# Patient Record
Sex: Female | Born: 1980 | Race: Black or African American | Hispanic: No | Marital: Single | Smoking: Current every day smoker
Health system: Southern US, Community
[De-identification: ages and names within clinical notes are randomized; demographics above are authoritative.]

---
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
## 2001-07-27 ENCOUNTER — Emergency Department (HOSPITAL_COMMUNITY): Admission: EM | Admit: 2001-07-27 | Discharge: 2001-07-28 | Payer: Self-pay | Admitting: Emergency Medicine

## 2002-09-14 ENCOUNTER — Inpatient Hospital Stay (HOSPITAL_COMMUNITY): Admission: AD | Admit: 2002-09-14 | Discharge: 2002-09-17 | Payer: Self-pay | Admitting: Obstetrics

## 2002-09-14 ENCOUNTER — Encounter (INDEPENDENT_AMBULATORY_CARE_PROVIDER_SITE_OTHER): Payer: Self-pay | Admitting: *Deleted

## 2004-06-03 ENCOUNTER — Emergency Department (HOSPITAL_COMMUNITY): Admission: EM | Admit: 2004-06-03 | Discharge: 2004-06-04 | Payer: Self-pay | Admitting: Emergency Medicine

## 2005-09-25 ENCOUNTER — Emergency Department (HOSPITAL_COMMUNITY): Admission: EM | Admit: 2005-09-25 | Discharge: 2005-09-25 | Payer: Self-pay | Admitting: Emergency Medicine

## 2006-08-03 ENCOUNTER — Inpatient Hospital Stay (HOSPITAL_COMMUNITY): Admission: AD | Admit: 2006-08-03 | Discharge: 2006-08-03 | Payer: Self-pay | Admitting: Obstetrics and Gynecology

## 2008-02-29 ENCOUNTER — Emergency Department (HOSPITAL_COMMUNITY): Admission: EM | Admit: 2008-02-29 | Discharge: 2008-03-01 | Payer: Self-pay | Admitting: Emergency Medicine

## 2008-11-08 ENCOUNTER — Inpatient Hospital Stay (HOSPITAL_COMMUNITY): Admission: EM | Admit: 2008-11-08 | Discharge: 2008-11-11 | Payer: Self-pay | Admitting: Emergency Medicine

## 2009-02-09 IMAGING — US US TRANSVAGINAL NON-OB
1 series · 14 of 25 positions shown · non-contrast
Comparison: None.

CLINICAL DATA: 25 year old with dysmenorrhea.
 TRANSABDOMINAL AND TRANSVAGINAL PELVIC ULTRASOUND ? 08/03/06:
TECHNIQUE: Both transabdominal and transvaginal ultrasound examinations of the pelvis were performed including evaluation of the uterus, ovaries, adnexal regions, and pelvic cul-de-sac.

[Series 1: us pelvis complete modify · 14 of 42 slices shown]
[im 1/42]
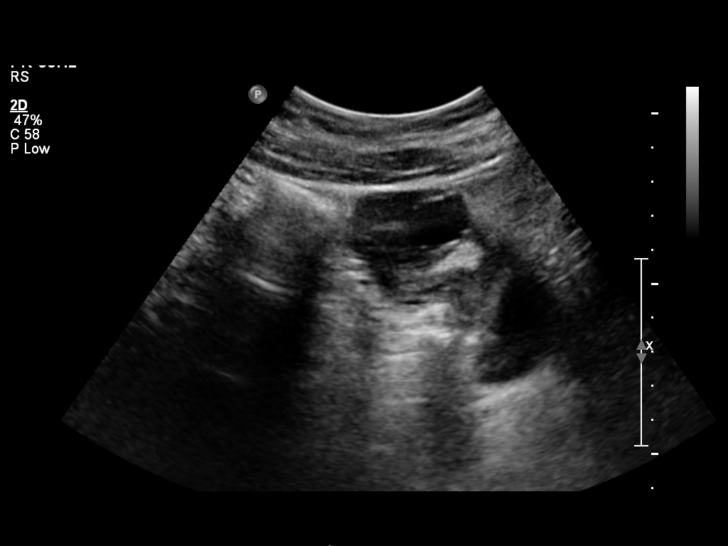
[im 4/42]
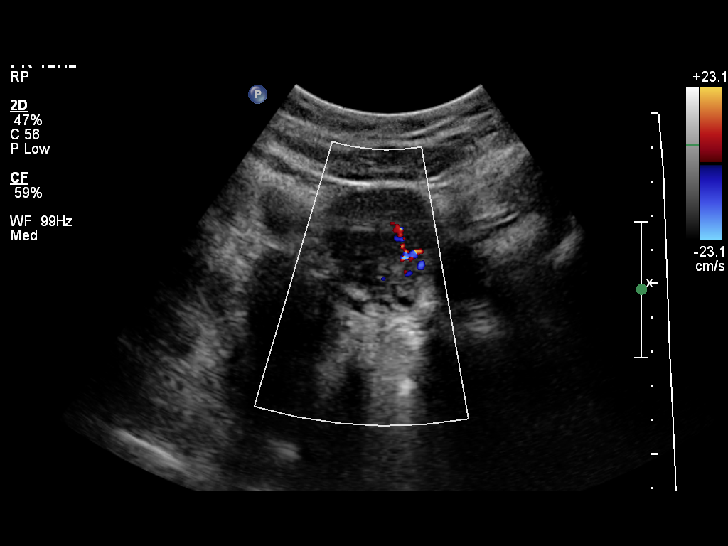
[im 7/42]
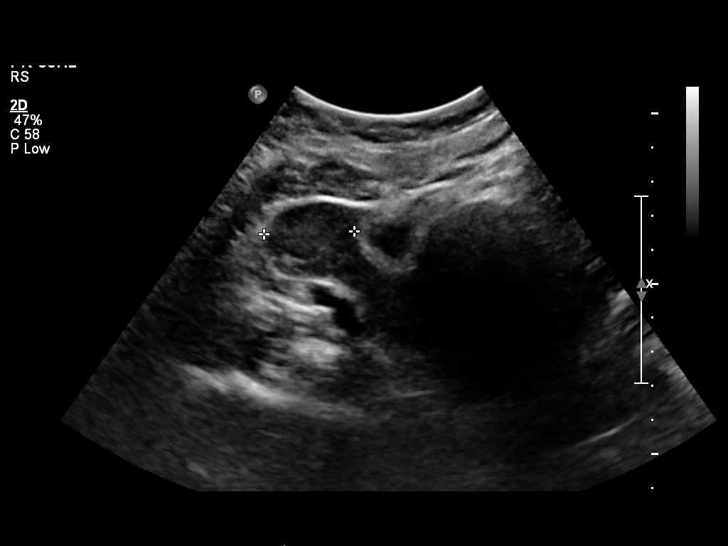
[im 11/42]
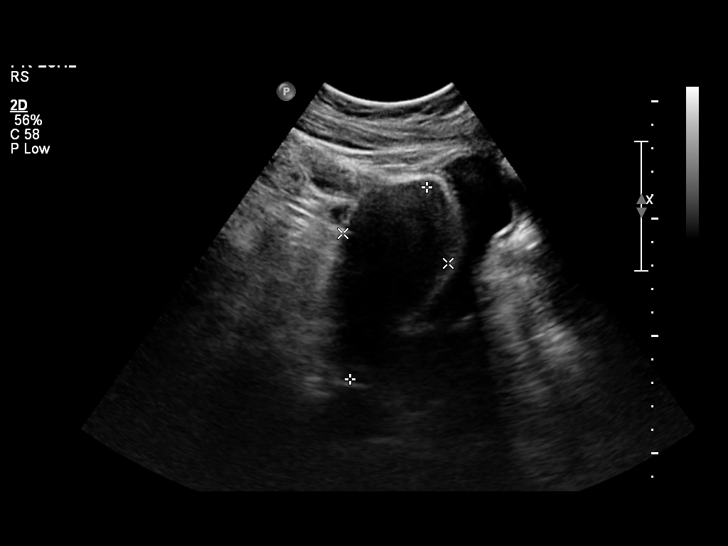
[im 14/42]
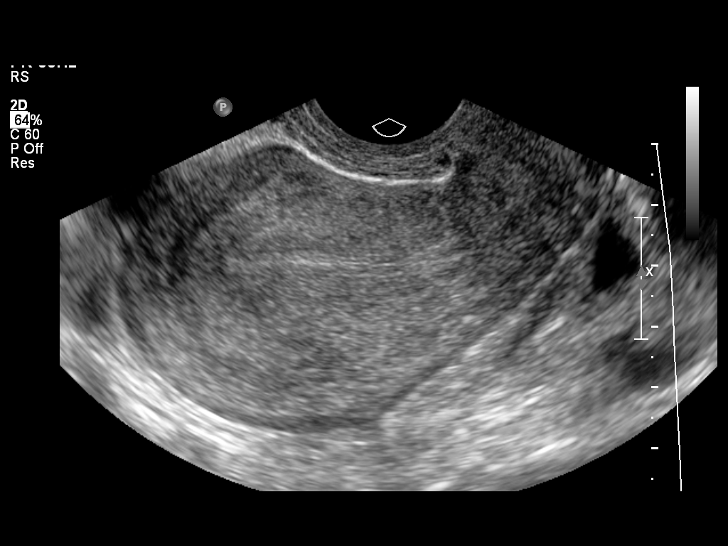
[im 16/42]
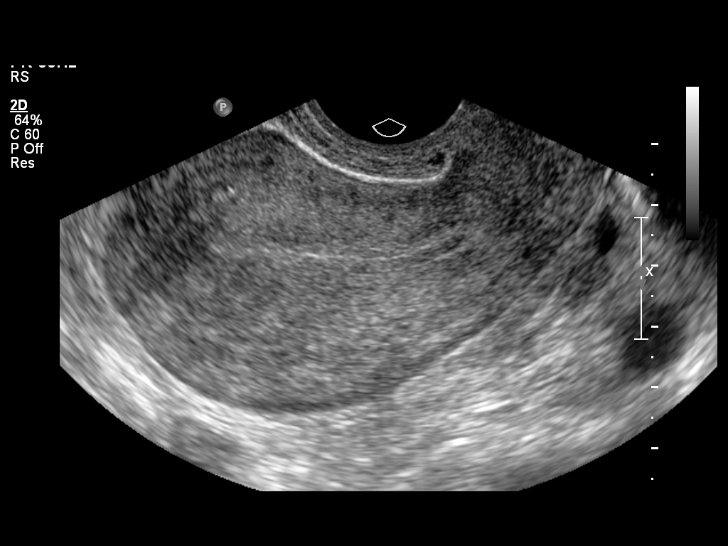
[im 19/42]
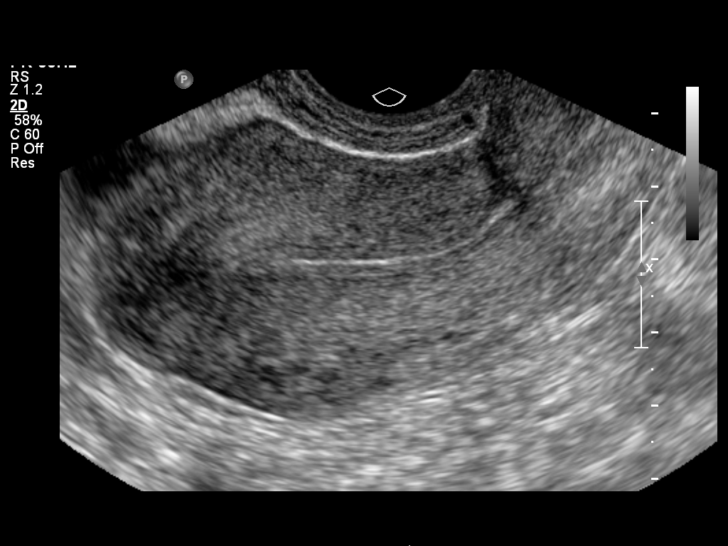
[im 23/42]
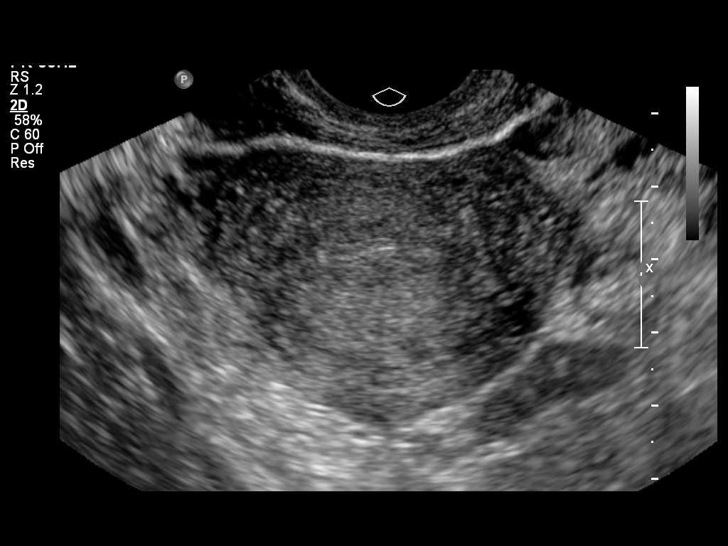
[im 26/42]
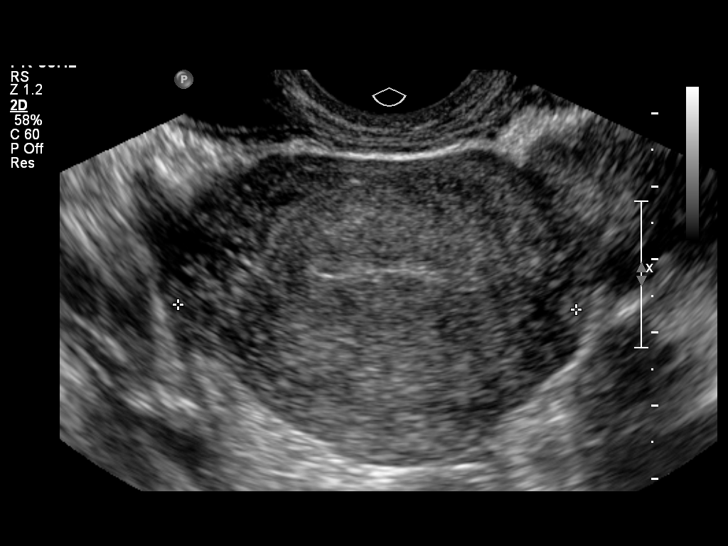
[im 28/42]
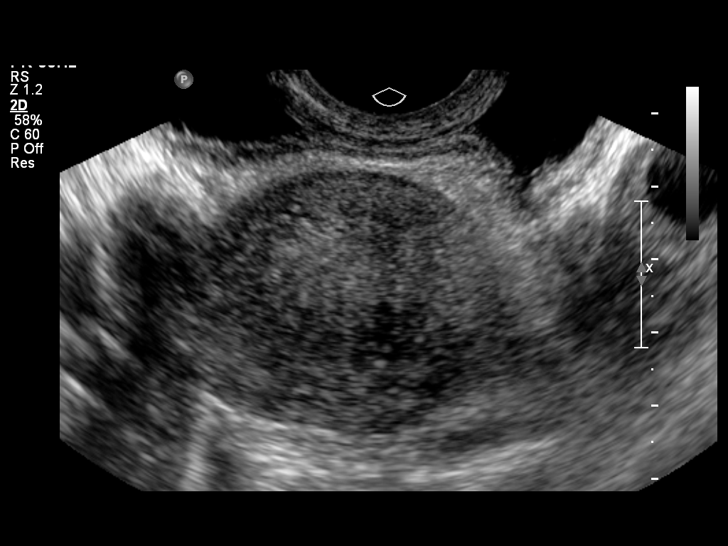
[im 31/42]
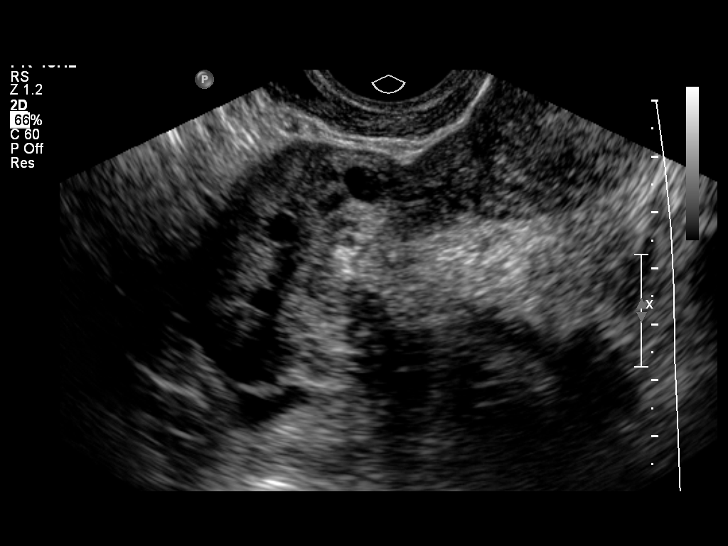
[im 35/42]
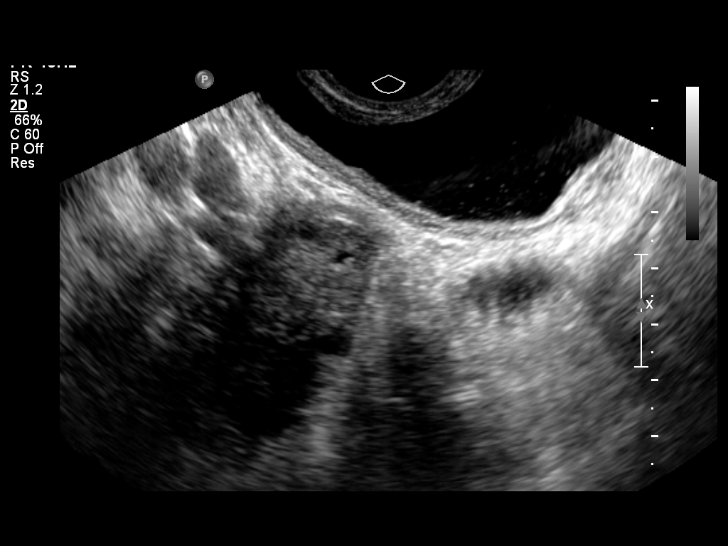
[im 38/42]
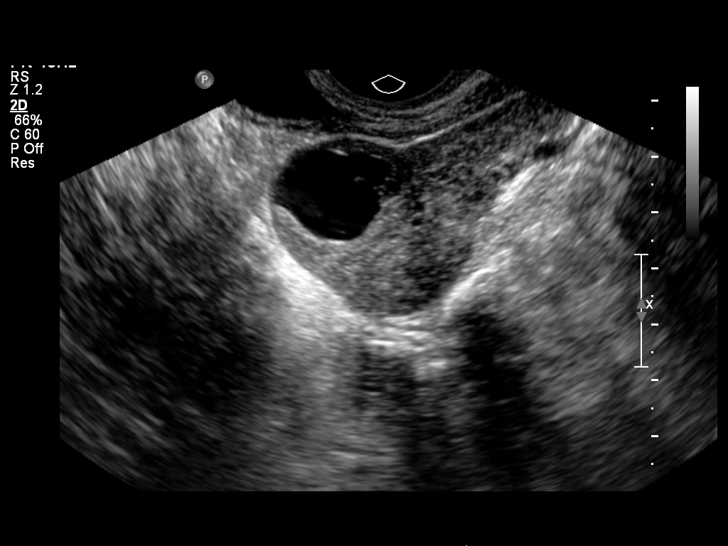
[im 42/42]
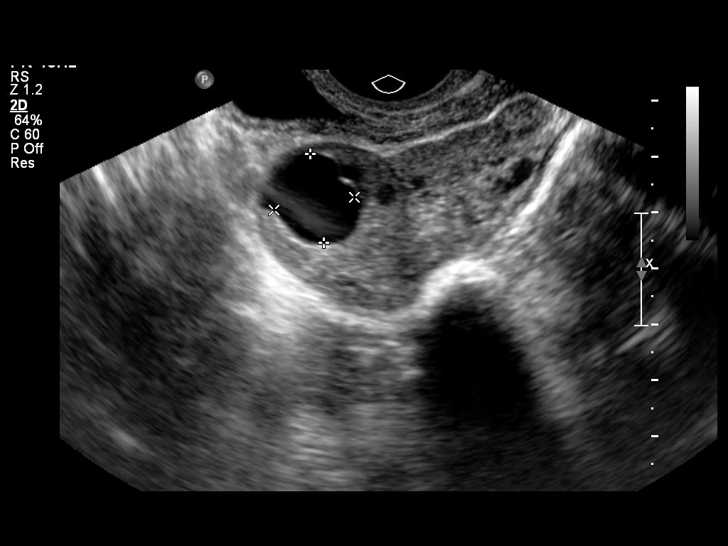

[14 of 25 positions shown; findings below may reference images not displayed]

FINDINGS: The uterus measures 8.8 x 4.3 x 5.5 cm.  The endometrium is normal in thickness measuring 4.3 mm.  No myometrial abnormalities are seen.  The right ovary measures 4.6 x 1.8 x 2.1 cm and the left ovary measures 3.4 x 2.6 x 3.1 cm.  It has a 1.6 x 1.4 cm cyst associated with it.  No free pelvic fluid collections are seen.
IMPRESSION: 1.  Unremarkable pelvic sonogram examination. Normal endometrium.  
 2.  1.6 cm cyst associated with the left ovary.

## 2010-06-22 LAB — URINE MICROSCOPIC-ADD ON

## 2010-06-22 LAB — CBC
HCT: 27.8 % — ABNORMAL LOW (ref 36.0–46.0)
HCT: 28.1 % — ABNORMAL LOW (ref 36.0–46.0)
HCT: 29.3 % — ABNORMAL LOW (ref 36.0–46.0)
HCT: 29.8 % — ABNORMAL LOW (ref 36.0–46.0)
Hemoglobin: 10 g/dL — ABNORMAL LOW (ref 12.0–15.0)
Hemoglobin: 12.3 g/dL (ref 12.0–15.0)
Hemoglobin: 9.4 g/dL — ABNORMAL LOW (ref 12.0–15.0)
Hemoglobin: 9.6 g/dL — ABNORMAL LOW (ref 12.0–15.0)
Hemoglobin: 9.9 g/dL — ABNORMAL LOW (ref 12.0–15.0)
MCHC: 33.6 g/dL (ref 30.0–36.0)
MCHC: 33.8 g/dL (ref 30.0–36.0)
MCHC: 34 g/dL (ref 30.0–36.0)
MCV: 92.1 fL (ref 78.0–100.0)
MCV: 93.4 fL (ref 78.0–100.0)
MCV: 93.4 fL (ref 78.0–100.0)
Platelets: 116 10*3/uL — ABNORMAL LOW (ref 150–400)
Platelets: 130 10*3/uL — ABNORMAL LOW (ref 150–400)
Platelets: 152 10*3/uL (ref 150–400)
RBC: 3.02 MIL/uL — ABNORMAL LOW (ref 3.87–5.11)
RDW: 13.5 % (ref 11.5–15.5)
RDW: 13.8 % (ref 11.5–15.5)
RDW: 13.8 % (ref 11.5–15.5)
RDW: 14 % (ref 11.5–15.5)
WBC: 4.6 10*3/uL (ref 4.0–10.5)
WBC: 5 10*3/uL (ref 4.0–10.5)
WBC: 7.6 10*3/uL (ref 4.0–10.5)

## 2010-06-22 LAB — COMPREHENSIVE METABOLIC PANEL
ALT: 10 U/L (ref 0–35)
ALT: 12 U/L (ref 0–35)
AST: 15 U/L (ref 0–37)
Albumin: 2.2 g/dL — ABNORMAL LOW (ref 3.5–5.2)
Alkaline Phosphatase: 41 U/L (ref 39–117)
BUN: 5 mg/dL — ABNORMAL LOW (ref 6–23)
CO2: 22 mEq/L (ref 19–32)
Calcium: 7.8 mg/dL — ABNORMAL LOW (ref 8.4–10.5)
Calcium: 8.7 mg/dL (ref 8.4–10.5)
Chloride: 110 mEq/L (ref 96–112)
Creatinine, Ser: 1.14 mg/dL (ref 0.4–1.2)
GFR calc Af Amer: 52 mL/min — ABNORMAL LOW (ref >60)
GFR calc Af Amer: >60 mL/min (ref >60)
GFR calc non Af Amer: 57 mL/min — ABNORMAL LOW (ref >60)
Glucose, Bld: 100 mg/dL — ABNORMAL HIGH (ref 70–99)
Glucose, Bld: 106 mg/dL — ABNORMAL HIGH (ref 70–99)
Potassium: 3.8 mEq/L (ref 3.5–5.1)
Sodium: 137 mEq/L (ref 135–145)
Sodium: 138 mEq/L (ref 135–145)
Total Bilirubin: 0.6 mg/dL (ref 0.3–1.2)
Total Protein: 5.7 g/dL — ABNORMAL LOW (ref 6.0–8.3)
Total Protein: 7.9 g/dL (ref 6.0–8.3)

## 2010-06-22 LAB — TSH: TSH: 1.548 u[IU]/mL (ref 0.350–4.500)

## 2010-06-22 LAB — DIFFERENTIAL
Eosinophils Absolute: 0 10*3/uL (ref 0.0–0.7)
Lymphocytes Relative: 12 % (ref 12–46)
Lymphs Abs: 1.5 10*3/uL (ref 0.7–4.0)
Monocytes Relative: 7 % (ref 3–12)
Neutrophils Relative %: 80 % — ABNORMAL HIGH (ref 43–77)

## 2010-06-22 LAB — BASIC METABOLIC PANEL
BUN: 5 mg/dL — ABNORMAL LOW (ref 6–23)
BUN: 7 mg/dL (ref 6–23)
BUN: 8 mg/dL (ref 6–23)
CO2: 23 mEq/L (ref 19–32)
CO2: 24 mEq/L (ref 19–32)
Calcium: 8.5 mg/dL (ref 8.4–10.5)
Chloride: 111 mEq/L (ref 96–112)
Chloride: 112 mEq/L (ref 96–112)
Creatinine, Ser: 1.09 mg/dL (ref 0.4–1.2)
GFR calc Af Amer: >60 mL/min (ref >60)
GFR calc non Af Amer: 55 mL/min — ABNORMAL LOW (ref >60)
GFR calc non Af Amer: >60 mL/min (ref >60)
Glucose, Bld: 102 mg/dL — ABNORMAL HIGH (ref 70–99)
Glucose, Bld: 90 mg/dL (ref 70–99)
Glucose, Bld: 90 mg/dL (ref 70–99)
Potassium: 4.1 mEq/L (ref 3.5–5.1)
Potassium: 4.2 mEq/L (ref 3.5–5.1)
Sodium: 136 mEq/L (ref 135–145)
Sodium: 137 mEq/L (ref 135–145)
Sodium: 139 mEq/L (ref 135–145)

## 2010-06-22 LAB — CULTURE, BLOOD (ROUTINE X 2): Culture: NO GROWTH

## 2010-06-22 LAB — TYPE AND SCREEN

## 2010-06-22 LAB — URINALYSIS, ROUTINE W REFLEX MICROSCOPIC
Glucose, UA: NEGATIVE mg/dL
Ketones, ur: 15 mg/dL — AB
Nitrite: NEGATIVE
Protein, ur: 100 mg/dL — AB
Protein, ur: 30 mg/dL — AB
Specific Gravity, Urine: 1.019 (ref 1.005–1.030)
Urobilinogen, UA: 4 mg/dL — ABNORMAL HIGH (ref 0.0–1.0)
pH: 6 (ref 5.0–8.0)

## 2010-06-22 LAB — PROTIME-INR
INR: 1.2 (ref 0.00–1.49)
Prothrombin Time: 15.4 seconds — ABNORMAL HIGH (ref 11.6–15.2)

## 2010-06-22 LAB — LIPASE, BLOOD: Lipase: 10 U/L — ABNORMAL LOW (ref 11–59)

## 2010-06-22 LAB — APTT: aPTT: 36 seconds (ref 24–37)

## 2010-06-22 LAB — URINE CULTURE: Culture: NO GROWTH

## 2010-07-30 NOTE — H&P (Signed)
[name], SHADRICK NO.:  [id number]   MEDICAL RECORD NO.:  [id number]          PATIENT TYPE:  EMS   LOCATION:  ED                           FACILITY:  [facility]   PHYSICIAN:  Peggye Pitt, M.D. DATE OF BIRTH:  1980/03/22   DATE OF ADMISSION:  11/08/2008  DATE OF DISCHARGE:                              HISTORY & PHYSICAL   PRIMARY CARE PHYSICIAN:  She does not have one.  That makes her  unassigned to [place].   CHIEF COMPLAINT:  Right flank pain and nausea, vomiting.   HISTORY OF PRESENT ILLNESS:  Karen Baird is a very pleasant 30 year old  African American woman with no significant past medical history who  started experiencing some nausea, vomiting and abdominal pain after  eating at [employer] last Sunday (3 days prior to admission).  Vomiting  initially consisted of food contents and now has turned into the clear  yellowish fluid.  She has not noticed any blood in her emesis.  She has  been unable to tolerate any p.o.  She also developed exquisite right  flank pain and tenderness for the same time period.  Of note she denies  any dysuria but she has had increased urinary frequency as well as a  change in the color of her urine.  She has also had fevers and chills at  home although she has not taken her temperature.  She denies any  headache, chest pain, shortness of breath, diarrhea or any other  symptoms.   ALLERGIES:  She has no known drug allergies.   PAST MEDICAL HISTORY:  Noncontributory.   HOME MEDICATIONS:  None.   SOCIAL HISTORY:  She smokes about half a pack a day.  He denies any  alcohol or illicit drug use.  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   FAMILY HISTORY:  Significant for diabetes and high blood pressure in  multiple family members.   REVIEW OF SYSTEMS:  Negative except as already mentioned in HPI.   PHYSICAL EXAM:  VITAL SIGNS:  Upon admission blood pressure 125/81,  heart rate 120,  respirations 20, O2 sats 95% on room air with a  temperature of 102.8.  GENERAL:  She is alert, awake, oriented x3.  Is very diaphoretic.  Her  face is flushed.  She appears to be in moderate distress and is lying on  her left side because of significant pain on her right flank when she  lies on the right side.  HEENT:  Normocephalic, atraumatic.  Her pupils are equally reactive to  light and accommodation with intact extraocular muscles.  NECK:  Supple.  No JVD, no lymphadenopathy, no bruits or goiter.  LUNGS:  Clear to auscultation bilaterally.  HEART:  Tachycardiac with a regular rhythm.  No murmurs, rubs or  gallops.  ABDOMEN:  Her abdomen is soft, nontender, nondistended with positive  bowel sounds.  She has exquisite a right CVA tenderness and some mild  left CVA tenderness.  She denies any suprapubic tenderness to palpation.  EXTREMITIES:  She has no edema with  positive pedal pulses.  NEUROLOGIC:  Exam is grossly intact and nonfocal.   LABS UPON ADMISSION:  Sodium 137, potassium 3.3, chloride 106, bicarb  24, BUN 11, creatinine 1.47 with a glucose of 106 and albumin of 3.4,  otherwise liver function tests are within normal limits.  WBCs of 12.2  with an ANC of 9.8, hemoglobin of 12.3 and a platelet count of 168,000.  Lipase is 10 and a urinary pregnancy test is negative.  Urinalysis shows  a large leukocyte esterase with positive nitrates, moderate blood, many  bacteria and WBCs too numerous to count.  A CT scan of the abdomen and  pelvis shows a question of acute cholecystitis with patchy left lower  lobe atelectasis versus pneumonia.  Abdominal ultrasound that showed  gallbladder adenomyomatosis and no cholelithiasis.   ASSESSMENT AND PLAN:  1. For her acute pyelonephritis.  Will immediately start her on IV      Rocephin.  We will panculture including urine and blood and will      start her on IV fluids.  2. Her acute renal insufficiency which is likely secondary to volume       depletion given her decreased p.o. intake in the presence of      pyelonephritis, diaphoresis, and fever.  Will give her 1000 mL      normal saline and saline bolus and then continue at 125 mL an hour      normal saline.  If her creatinine fails to improve with IV fluids,      we will consider a more aggressive workup for acute renal failure      including a renal ultrasound to rule out obstruction or      hydronephrosis as a possible cause.  3. Her hypokalemia which is likely secondary to her emesis.  Will      replete this p.o. and check a magnesium level.  4. Leukocytosis which is again secondary to her pyelonephritis.  We      will monitor with antibiotics.  5. Prophylaxis.  She will be on Protonix for gastrointestinal      prophylaxis and Lovenox for deep vein thrombosis prophylaxis.      Peggye Pitt, M.D.  Electronically Signed     EH/MEDQ  D:  11/08/2008  T:  11/08/2008  Job:  [id number]

## 2010-07-30 NOTE — Discharge Summary (Signed)
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  2. Urinary tract infection with pyelonephritis.  3. Overweight.  4. Mildly enlarged ovarian size.   PRIMARY CARE PHYSICIAN:  Unassigned.   CHIEF COMPLAINT:  Abdominal pain.   PROCEDURE:  CT abdomen and pelvis on November 08, 2008.  Impression was  showing:  1. Image quality degraded by breathing motion artifact.  2. Cannot exclude acute cholecystitis.  Please see above discussion.  3. Patchy left lower subsegmental atelectasis or pneumonia.  In the      pelvis CT scan, the impression is normal except for a mildly      enlarged ovarian size.   Ultrasound abdomen on November 08, 2008, impression was showing:  1. Findings suggest gallbladder adenomyomatosis.  This is a benign      condition that can lead to abdominal pain.  2. No evidence of cholelithiasis.  3. Eulah Pont test not obtainable secondary to patient pain medication.   Her recent labs were showing sodium 136, potassium 4.2, chloride 111,  bicarb 24, glucose 90, BUN is 5 creatinine is 1.0.  Her white blood  count today is 4.6, hemoglobin is 9.6, hematocrit is 28.1, platelet  count is 167.   HOSPITAL COURSE BY PROBLEM:  1. Right flank pain, nausea, and vomiting.  The patient admitted as      pyelonephritis and she was started on antibiotic.  Urine culture      was sent, which has turned out to be negative.  The patient      improved on Rocephin in any event and she will be discharged to      continue on Augmentin.  Of note, the patient had many bacteria on      the  urinalysis done on August 25th.  She has large leukocyte-      esterase and positive nitrate as well.  On the basis of this      urinalysis, the patient was treated for a urinary tract      infection/pyelonephritis.  2. Abdominal pain.  This is felt secondary to pyelonephritis, although      the urine culture failed to yield significant organism.  3. Abnormal imaging/incidentaloma in the form of adenomyomatosis and      ovarian cyst.  The finding discussed with the patient.  She is      aware and agreeable to the followup plans.   DISCHARGE MEDICATIONS:  The patient will be discharged on Augmentin 875  mg p.o. for 7 more days.   DISCHARGE PLAN:  The patient is aware of the finding of the CT and  ultrasound.  She was advised to continue on  antibiotic for at least 7  days.  She was also advised to follow up with a primary physician and  GYN as discussed with her.  She is aware and agreeable to the followup  plan.      Beckey Rutter, MD  Electronically Signed     EME/MEDQ  D:  11/11/2008  T:  11/11/2008  Job:  [id number] [id number]

## 2010-08-02 NOTE — H&P (Signed)
   NAME:  Karen Baird, VANBERGEN                         ACCOUNT NO.:  [id number]   MEDICAL RECORD NO.:  [id number]                   PATIENT TYPE:  INP   LOCATION:  9102                                 FACILITY:  [facility]   PHYSICIAN:  Kathreen Cosier, M.D.           DATE OF BIRTH:  11/04/1980   DATE OF ADMISSION:  09/14/2002  DATE OF DISCHARGE:                                HISTORY & PHYSICAL   PREOPERATIVE HISTORY AND PHYSICAL:   BRIEF HISTORY:  The patient is a 30 year old gravida 1, para 0, EDC September 27, 2002, gestational diabetic followed by Dr. Andi Devon and her control on that,  her sugars are all normal.  The patient admitted in early labor, cervix 1  cm, long, vertex, -3 and having late decelerations with these contractions.  Her baseline was also 160.  She was placed on oxygen and her position  changed.  There was no change in her late decels so it was decided she would  be delivered by C-section for nonreassuring fetal heart rate tracings.  She  was also positive GBS.   PHYSICAL EXAMINATION:  GENERAL:  Obese female in early labor.  HEENT:  Negative.  LUNGS:  Clear.  HEART:  Regular rhythm; no murmurs, no gallops.  ABDOMEN:  __________-sized uterus.  Cervix 1 cm and long.  EXTREMITIES:  Negative.                                               Kathreen Cosier, M.D.    BAM/MEDQ  D:  09/14/2002  T:  09/14/2002  Job:  [id number]

## 2010-08-02 NOTE — Discharge Summary (Signed)
   NAME:  Karen Baird, Karen Baird                         ACCOUNT NO.:  [id number]   MEDICAL RECORD NO.:  [id number]                   PATIENT TYPE:  INP   LOCATION:  9102                                 FACILITY:  [facility]   PHYSICIAN:  Kathreen Cosier, M.D.           DATE OF BIRTH:  1980-09-04   DATE OF ADMISSION:  09/14/2002  DATE OF DISCHARGE:  09/17/2002                                 DISCHARGE SUMMARY   DISCHARGE SUMMARY:  The patient is a 30 year old gravida 1, EDC September 27, 2002, gestational diabetic - controlled by diet.  Patient was admitted in  early labor service, 1 cm long, vertex, -3 and was having __________  deceleration with each contraction and a baseline of 160.  There was no  improvement with oxygen administration or position changes and she delivered  by C-section for non-reassuring fetal heart rate tracing.  She underwent a  primary low-transverse cesarean section and had a female with Apgar's of 9 and  9, weight 8 pounds and cord pH of 7.30.  The placenta was sent to pathology.  Postoperatively she did well.  Her hemoglobin was 8.3.  She was discharged  home on the third postoperative day ambulatory on a regular diet to see me  in six weeks.   DISCHARGE DIAGNOSIS:  Status post primary low-transverse cesarean section at  term for non-reassuring fetal heart rate tracing.                                               Kathreen Cosier, M.D.    BAM/MEDQ  D:  09/17/2002  T:  09/17/2002  Job:  [id number]

## 2010-08-02 NOTE — Op Note (Signed)
   NAME:  Karen Baird, Karen Baird                         ACCOUNT NO.:  [id number]   MEDICAL RECORD NO.:  [id number]                   PATIENT TYPE:  INP   LOCATION:  9199                                 FACILITY:  [facility]   PHYSICIAN:  Kathreen Cosier, M.D.           DATE OF BIRTH:  Apr 04, 1980   DATE OF PROCEDURE:  09/14/2002  DATE OF DISCHARGE:                                 OPERATIVE REPORT   PREOPERATIVE DIAGNOSES:  1. Intrauterine pregnancy at 38 weeks.  2. Gestational diabetes on diet.  3. Early labor with late decelerations.   SURGEON:  Kathreen Cosier, M.D.   ANESTHESIA:  Spinal.   DESCRIPTION OF PROCEDURE:  The patient placed on the operating table in the  supine position after spinal administered.  Abdomen prepped and draped.  Bladder emptied with a Foley catheter.  Transverse suprapubic incision made  and carried down to the rectus fascia.  Fascia cleanly incised the length of  the incision.  Rectus muscles were retracted laterally.  Peritoneum incised  longitudinally.  Transverse incision made in the visceral peritoneum above  the bladder.  Bladder mobilized inferiorly.  Transverse lower uterine  incision made.  The fluid was clear.  It was noted the cord was prolapsed  aside the head.  Delivery of the vertex was effected with the vacuum and  there were two pop-offs.  Eventually the rectus muscle was cut on the right.  She was delivered from the ROA position of a female with Apgars 9 and 9,  weighing 8 pounds.  The placenta was anterior, removed manually and sent to  pathology.  Cord pH was 7.30 and the Apgar scores were 9 and 9.  The uterine  cavity cleaned with dry lap, uterine incision closed in one layer with  continuous suture of  #1 chromic.  Hemostasis was satisfactory.  Bladder  flap reattached with 2-0 chromic.  Uterus well contracted.  Tubes and  ovaries normal.  Abdomen closed in layers, peritoneum with continuous suture  of 0 chromic, fascia with continuous  suture of 0 Dexon and the skin closed  with subcuticular stitch of 3-0 plain.  Blood loss was 400 mL.  The patient  tolerated the procedure well and was taken to the recovery room in good  condition.                                               Kathreen Cosier, M.D.    BAM/MEDQ  D:  09/14/2002  T:  09/14/2002  Job:  [id number]

## 2010-09-07 IMAGING — US US ABDOMEN COMPLETE
1 series · 14 of 25 positions shown · non-contrast
Comparison: None available.

CLINICAL DATA: Abdominal pain for 1 month.

ABDOMEN ULTRASOUND
TECHNIQUE: Complete abdominal ultrasound examination was performed
including evaluation of the liver, gallbladder, bile ducts,
pancreas, kidneys, spleen, IVC, and abdominal aorta.

[Series 1: unknown · 0.25mm/px · 14 of 57 slices shown]
[im 1/57]
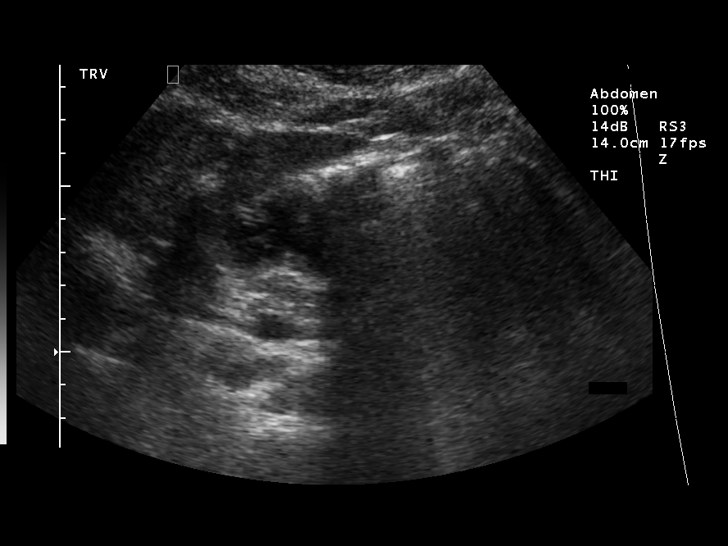
[im 5/57]
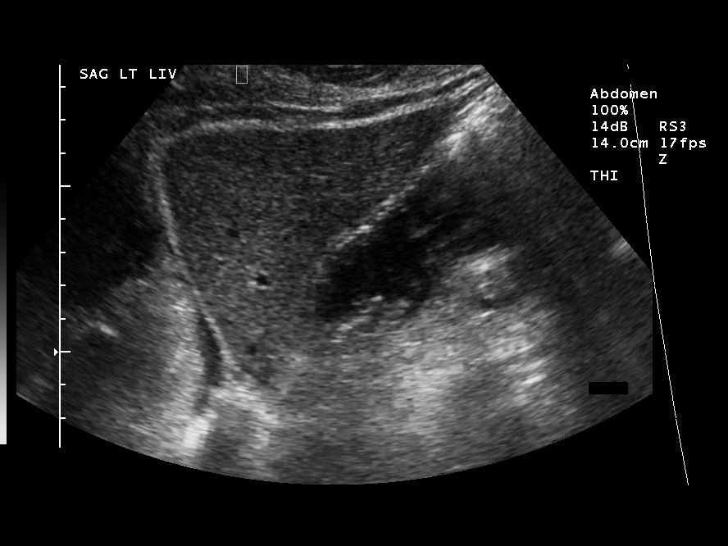
[im 10/57]
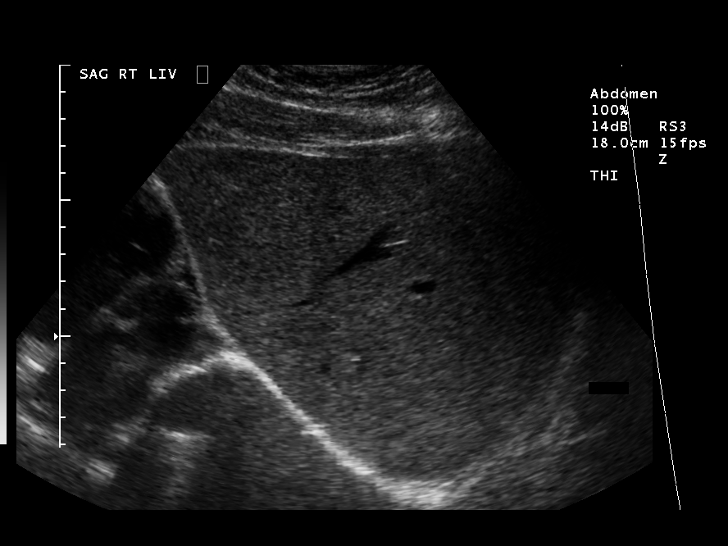
[im 15/57]
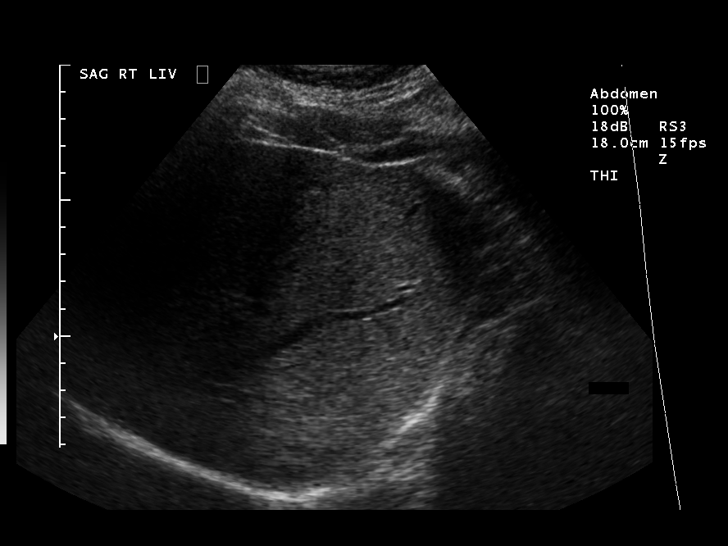
[im 19/57]
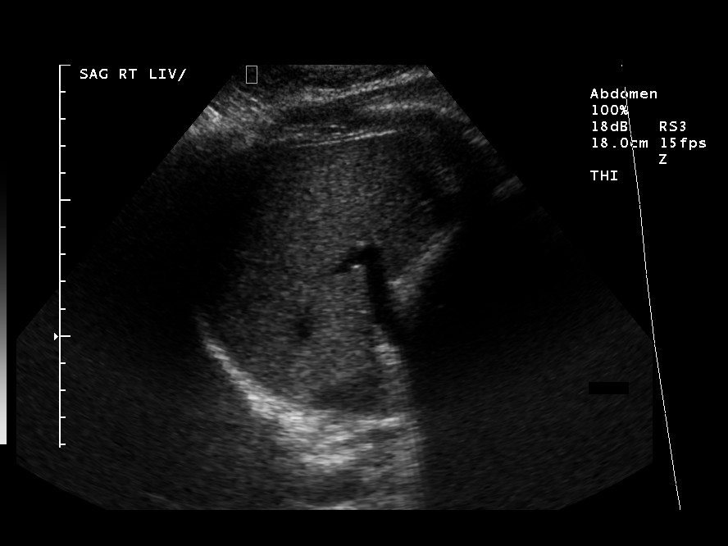
[im 22/57]
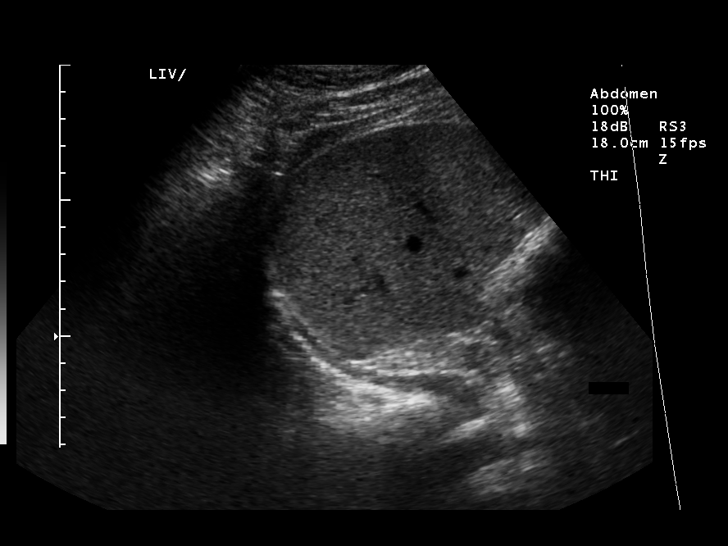
[im 26/57]
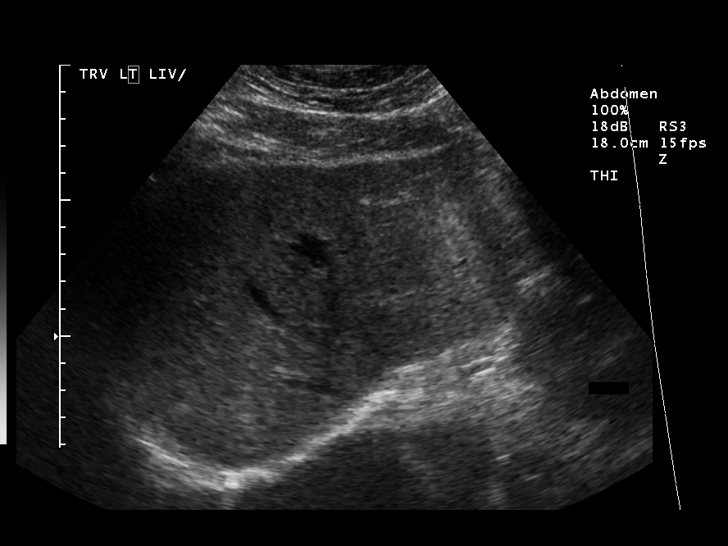
[im 31/57]
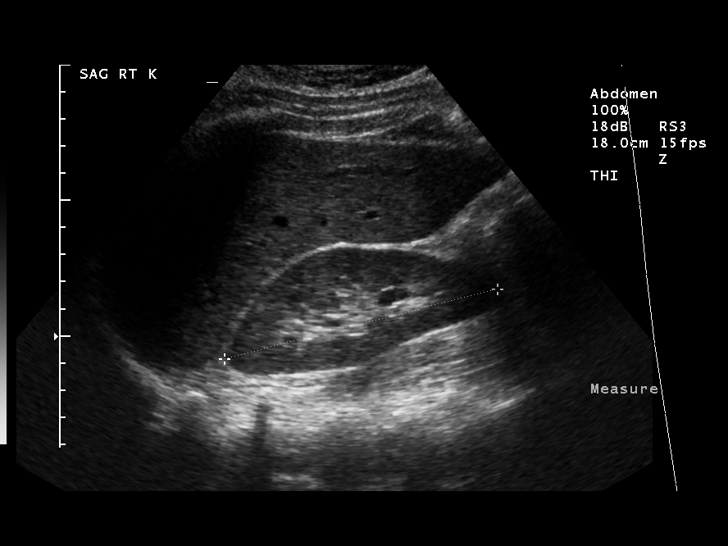
[im 36/57]
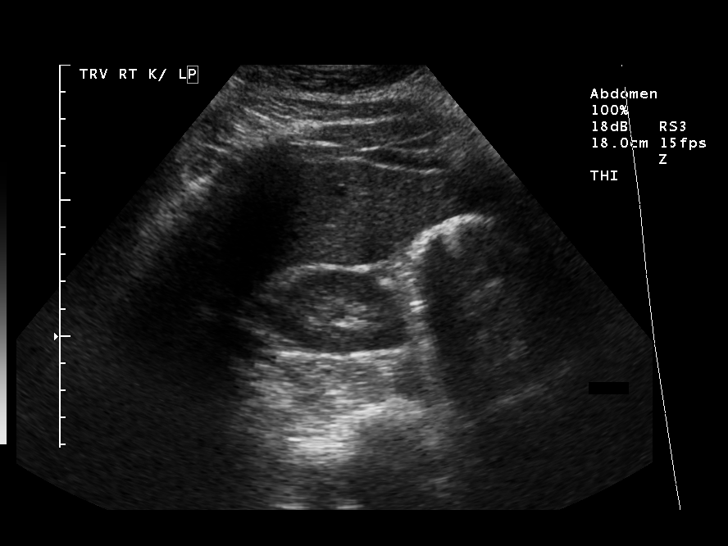
[im 38/57]
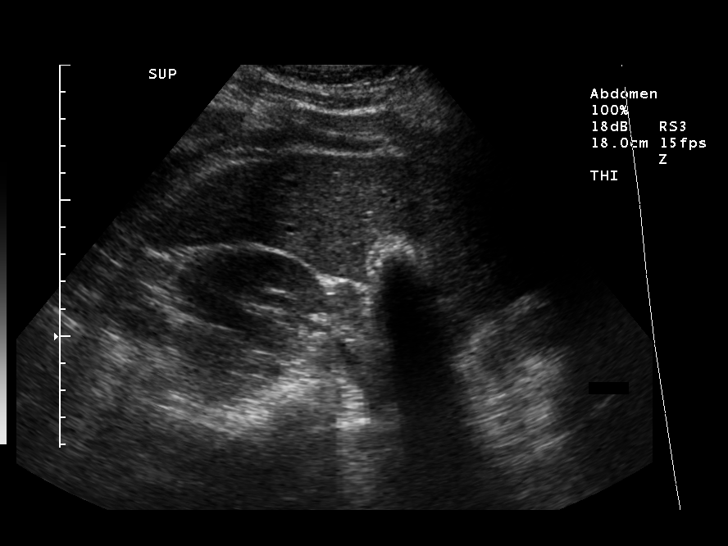
[im 43/57]
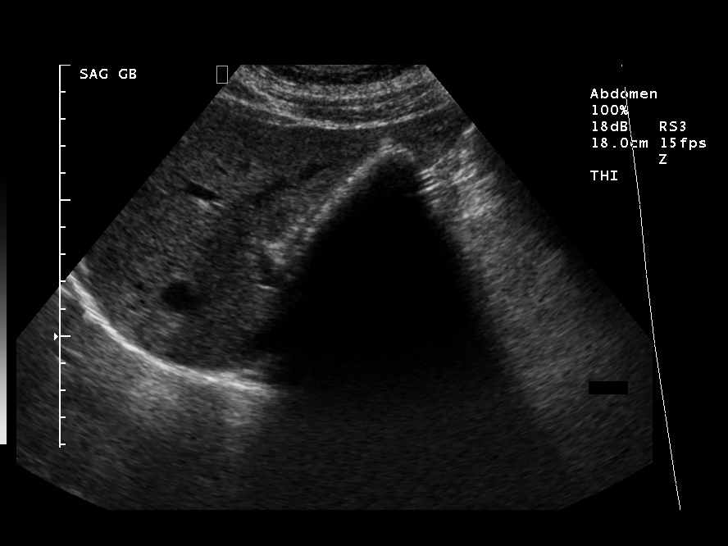
[im 47/57]
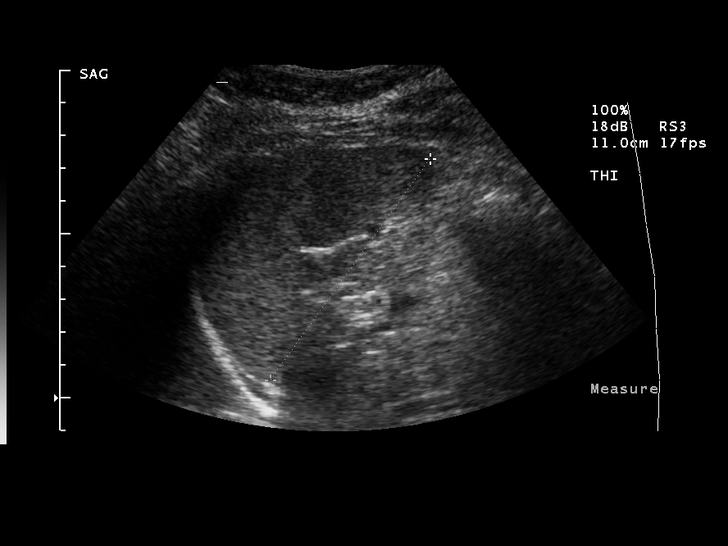
[im 52/57]
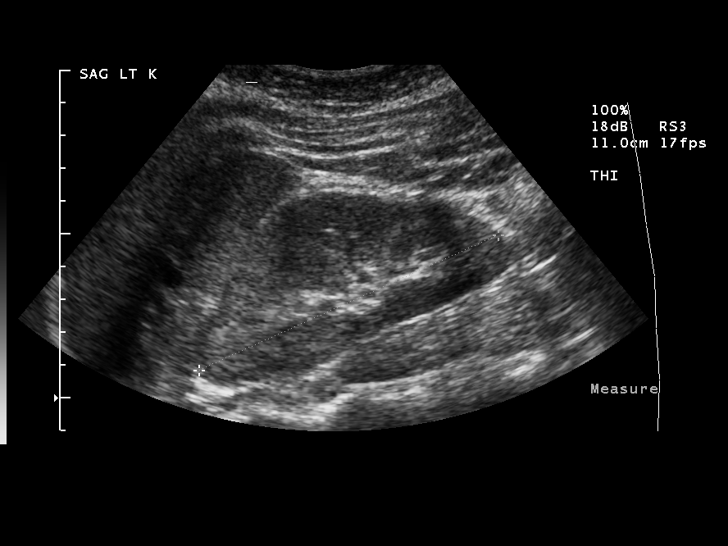
[im 57/57]
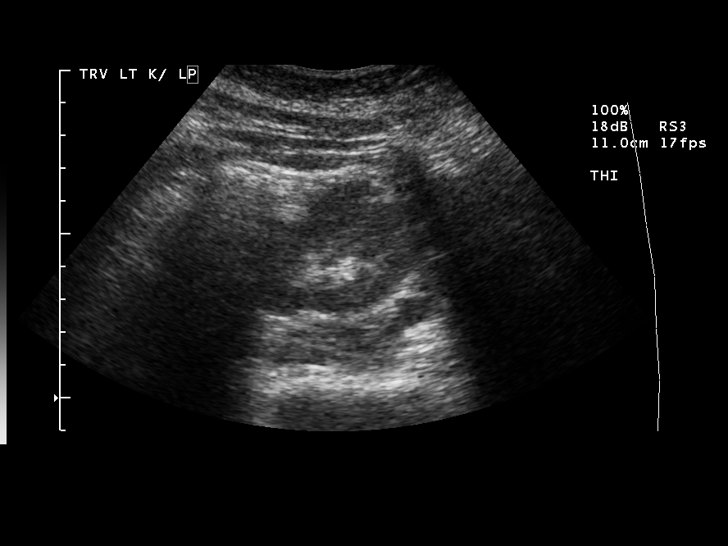

[14 of 25 positions shown; findings below may reference images not displayed]

FINDINGS: The gallbladder is filled with stones with a wall echo
shadow sign present.  This sign can also be seen in calcification
of the gallbladder wall although this is felt less likely.  There
is no gallbladder wall thickening or pericholecystic fluid.  The
sonographer reports negative Murphy's sign.

Common bile duct is normal at 0.5 cm.

The liver inferior and vena cava are unremarkable.  The pancreas is
poorly visualized due to overlying bowel gas but no abnormality is
seen on images provided.

The spleen measures 7.2 cm and appears normal.

The right kidney measures 10.4 cm and the left kidney measures
cm.  Both kidneys appear normal.

Abdominal aorta is not seen due to overlying bowel gas.
IMPRESSION: 1.  Multiple gallstones but no ultrasound evidence of acute
cholecystitis.

## 2010-12-20 LAB — COMPREHENSIVE METABOLIC PANEL
Alkaline Phosphatase: 111 U/L (ref 39–117)
BUN: 8 mg/dL (ref 6–23)
Calcium: 8.5 mg/dL (ref 8.4–10.5)
Creatinine, Ser: 1.06 mg/dL (ref 0.4–1.2)
Glucose, Bld: 97 mg/dL (ref 70–99)
Total Protein: 6.2 g/dL (ref 6.0–8.3)

## 2010-12-20 LAB — CBC
HCT: 35.8 % — ABNORMAL LOW (ref 36.0–46.0)
Hemoglobin: 11.8 g/dL — ABNORMAL LOW (ref 12.0–15.0)
MCHC: 32.9 g/dL (ref 30.0–36.0)
MCV: 97.3 fL (ref 78.0–100.0)
RDW: 12.7 % (ref 11.5–15.5)

## 2010-12-20 LAB — DIFFERENTIAL
Basophils Relative: 1 % (ref 0–1)
Lymphocytes Relative: 32 % (ref 12–46)
Lymphs Abs: 1.7 10*3/uL (ref 0.7–4.0)
Monocytes Relative: 4 % (ref 3–12)
Neutro Abs: 3.1 10*3/uL (ref 1.7–7.7)
Neutrophils Relative %: 58 % (ref 43–77)

## 2011-05-18 IMAGING — US US ABDOMEN COMPLETE
1 series · 4 of 4 positions shown · non-contrast
Comparison: CT abdomen 11/08/2008

Addendum Begins

On review of comparison CT from same day, there is evidence for
right renal edema, mild perinephric stranding, and patchy perfusion
suggesting right acute pyelonephritis.  These additional findings
were conveyed to Dr. Para on 11/08/2008 at 0609 hours.
Addendum Ends
CLINICAL DATA: Vomiting, fever, and abdominal pain 3 days.  The
patient receive medication prior to exam.
COMPLETE ABDOMINAL ULTRASOUND

[Series 1: us abdomen complete · 0.30mm/px · 4 of 4 slices shown]
[im 1/4]
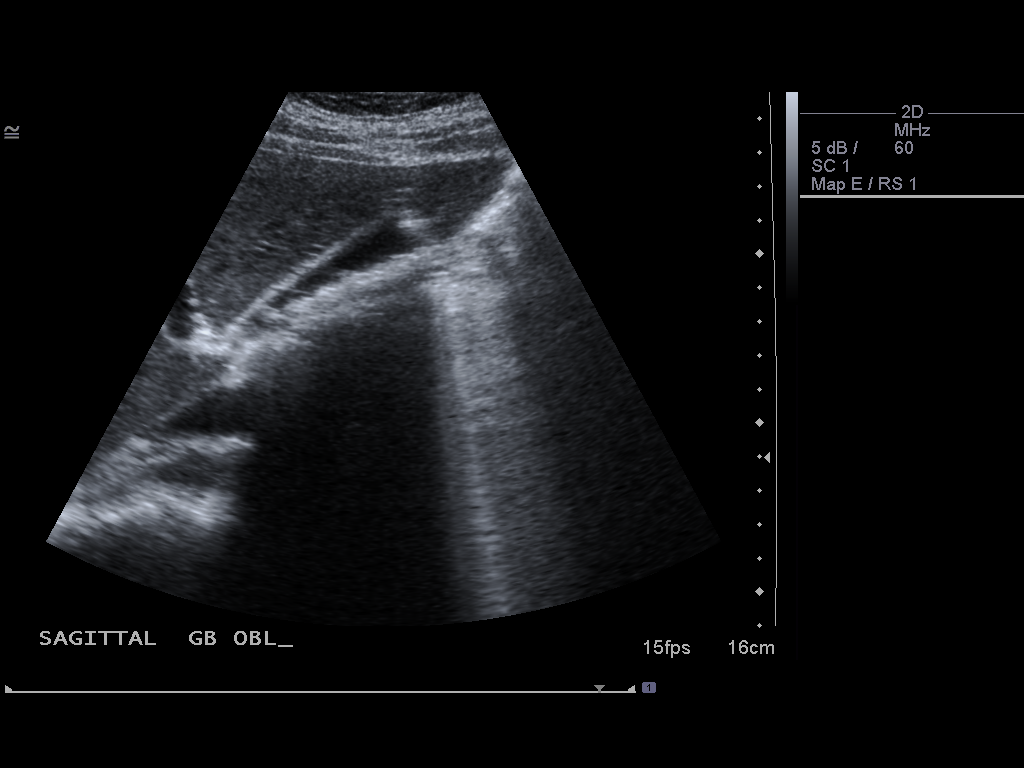
[im 2/4]
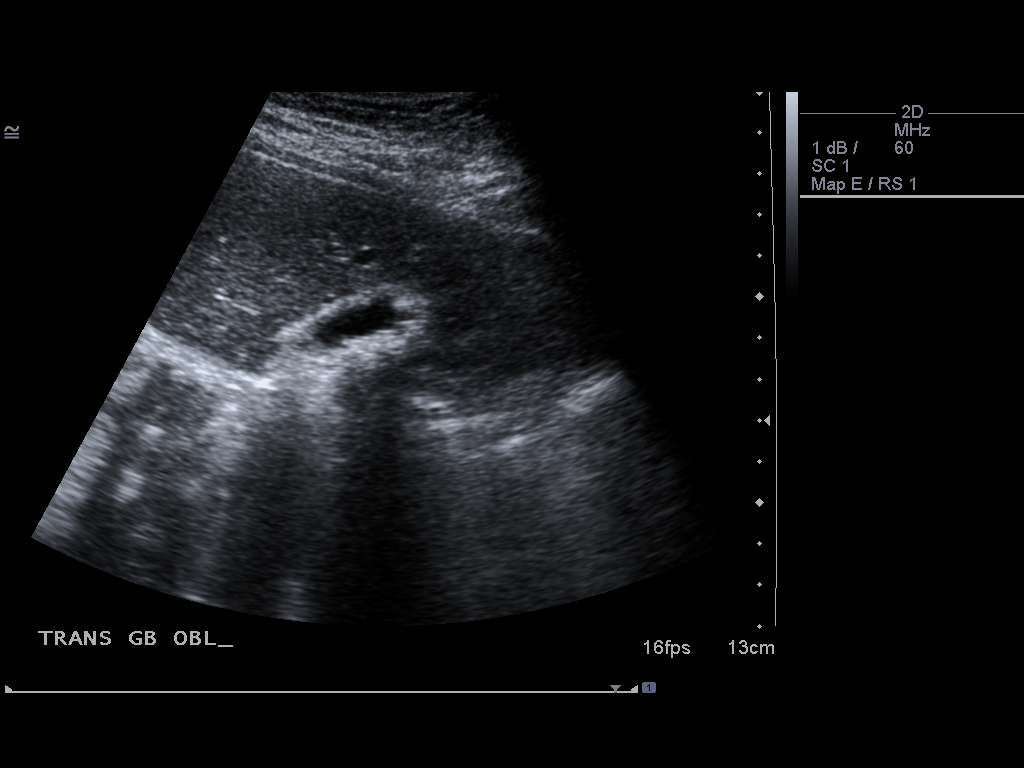
[im 3/4]
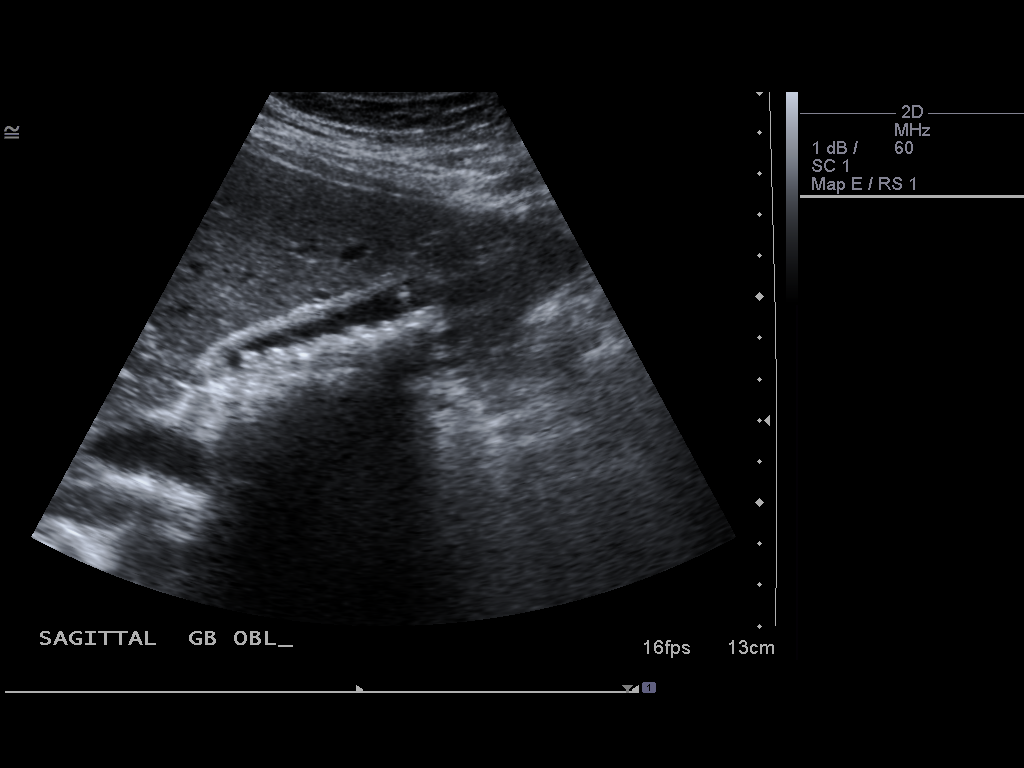
[im 4/4]
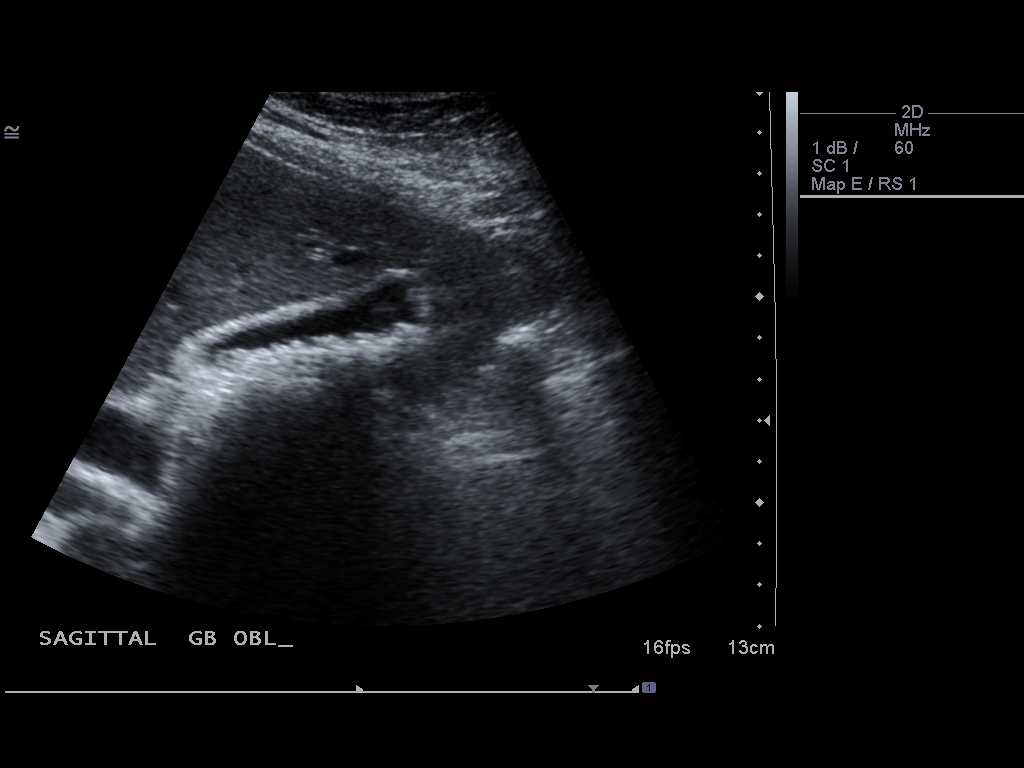

[4 of 4 positions shown; findings below may reference images not displayed]

FINDINGS: The liver is normal in contour and echogenicity.

The gallbladder wall is circumferentially thickened to 4.6 mm.  The
gallbladder is not distended.  There are echogenic foci within the
gallbladder wall with ring down artifact suggesting cholesterol
deposits.  No pericholecystic fluid is present.  No echogenic
gallstones are identified.  Sonographic Murphy's sign was
nondiagnostic as patient had  pain medication.

The common bile duct is normal caliber 3.2 mm.

The IVC and pancreas appear normal.

The spleen is normal echogenicity measuring 9.7 cm.

Right kidney measures 12.21 cm.  Left kidney measures 10.4 cm. No
evidence of hydronephrosis.

Abdominal aorta is normal in diameter 1.8 cm.
IMPRESSION: 1.  Finding suggests gallbladder adenomyomatosis.  This is a
benign condition but can lead to abdominal pain.
2.  No evidence of cholelithiasis.
3.   Rupal test  not obtainable secondary to patient pain
medication.

## 2018-02-26 ENCOUNTER — Other Ambulatory Visit: Payer: Self-pay

## 2018-02-26 ENCOUNTER — Ambulatory Visit (HOSPITAL_COMMUNITY)
Admission: EM | Admit: 2018-02-26 | Discharge: 2018-02-26 | Disposition: A | Discharge status: Home / Self Care | Payer: Self-pay | Attending: Family Medicine | Admitting: Family Medicine

## 2018-02-26 ENCOUNTER — Encounter (HOSPITAL_COMMUNITY): Payer: Self-pay | Admitting: Emergency Medicine

## 2018-02-26 DIAGNOSIS — R3989 Other symptoms and signs involving the genitourinary system: Secondary | ICD-10-CM

## 2018-02-26 DIAGNOSIS — Z202 Contact with and (suspected) exposure to infections with a predominantly sexual mode of transmission: Secondary | ICD-10-CM

## 2018-02-26 DIAGNOSIS — Z3202 Encounter for pregnancy test, result negative: Secondary | ICD-10-CM

## 2018-02-26 DIAGNOSIS — N9089 Other specified noninflammatory disorders of vulva and perineum: Secondary | ICD-10-CM | POA: Insufficient documentation

## 2018-02-26 DIAGNOSIS — R300 Dysuria: Secondary | ICD-10-CM | POA: Insufficient documentation

## 2018-02-26 DIAGNOSIS — N898 Other specified noninflammatory disorders of vagina: Secondary | ICD-10-CM | POA: Insufficient documentation

## 2018-02-26 DIAGNOSIS — Z113 Encounter for screening for infections with a predominantly sexual mode of transmission: Secondary | ICD-10-CM

## 2018-02-26 LAB — POCT URINALYSIS DIP (DEVICE)
Bilirubin Urine: NEGATIVE
Glucose, UA: NEGATIVE mg/dL
Nitrite: NEGATIVE
PH: 6.5 (ref 5.0–8.0)
Protein, ur: NEGATIVE mg/dL
Specific Gravity, Urine: 1.02 (ref 1.005–1.030)
Urobilinogen, UA: 1 mg/dL (ref 0.0–1.0)

## 2018-02-26 MED ORDER — ACYCLOVIR 400 MG PO TABS: 400.0000 mg | ORAL_TABLET | Freq: Three times a day (TID) | ORAL | 0 refills | Status: AC

## 2018-02-26 MED ORDER — ACYCLOVIR 400 MG PO TABS: 400.0000 mg | ORAL_TABLET | Freq: Three times a day (TID) | ORAL | 0 refills | Status: DC

## 2018-02-26 MED ORDER — AZITHROMYCIN 250 MG PO TABS
1000.0000 mg | ORAL_TABLET | Freq: Once | ORAL | Status: AC
  Administered 2018-02-26: 1000 mg via ORAL

## 2018-02-26 MED ORDER — ONDANSETRON 4 MG PO TBDP: 4.0000 mg | ORAL_TABLET | Freq: Three times a day (TID) | ORAL | 0 refills | Status: DC | PRN

## 2018-02-26 MED ORDER — AZITHROMYCIN 1 G PO PACK: 1.0000 g | PACK | Freq: Once | ORAL | 0 refills | Status: AC

## 2018-02-26 MED ORDER — CEFTRIAXONE SODIUM 250 MG IJ SOLR
250.0000 mg | Freq: Once | INTRAMUSCULAR | Status: AC
  Administered 2018-02-26: 250 mg via INTRAMUSCULAR

## 2018-02-26 MED ORDER — CEFTRIAXONE SODIUM 250 MG IJ SOLR
INTRAMUSCULAR | Status: AC
  Filled 2018-02-26: qty 250

## 2018-02-26 MED ORDER — AZITHROMYCIN 250 MG PO TABS
ORAL_TABLET | ORAL | Status: AC
  Filled 2018-02-26: qty 4

## 2018-02-26 NOTE — Discharge Instructions (Addendum)
We have treated you today for gonorrhea and chlamydia, with rocephin and azithromycin. Please refrain from sexual intercourse for 7 days while medicines eliminating infection.   We are testing the area that is causing you a lot of pain for herpes.  I recommend going ahead and starting the medicine for this based off the appearance of these lesions.  Please begin acyclovir 400 mg 3 times a day for the next 10 days, please return if symptoms not resolved within this time for more medicine.  We are testing you for Gonorrhea, Chlamydia, Trichomonas, Yeast and Bacterial Vaginosis. We will call you if anything is positive and let you know if you require any further treatment. Please inform partners of any positive results.   Please return if symptoms not improving with treatment, development of fever, nausea, vomiting, abdominal pain.

## 2018-02-26 NOTE — ED Triage Notes (Signed)
Pt cc she thinks she has a UTI and back pain x 3 days

## 2018-02-26 NOTE — ED Provider Notes (Signed)
[facility]    CSN: [id number] Arrival date & time: 02/26/18  1602     History   Chief Complaint Chief Complaint  Patient presents with  . Back Pain  . Urinary Tract Infection    HPI Karen Baird is a 37 y.o. female no significant past medical history presenting today for evaluation of possible UTI.  Patient states that over the past 3 days she has had discomfort with urination.  She is also had increased frequency and incomplete voiding.  She does note to have some vaginal discharge, but this is not atypical for her.  She denies any itching or irritation.  Last menstrual period was a couple weeks ago.  Patient notes that she drinks a lot of sodas and is concerned that this is caused a UTI.  She has had some mild lower back pain as well.  Denies any injury or increase in activity.  Denies fever nausea, vomiting.  She does note to have a lot of discomfort when she is not urinating as well.  She does admit to having a new partner recently and believes all of her symptoms have developed since.  She also has noticed an odor.  Denies history of yeast and BV.  States that she has not had a Pap smear in a while.  HPI  History reviewed. No pertinent past medical history.  There are no active problems to display for this patient.   History reviewed. No pertinent surgical history.  OB History   No obstetric history on file.      Home Medications    Prior to Admission medications   Medication Sig Start Date End Date Taking? Authorizing Provider  acyclovir (ZOVIRAX) 400 MG tablet Take 1 tablet (400 mg total) by mouth 3 (three) times daily for 10 days. 02/26/18 03/08/18  Aramis Weil C, PA-C  azithromycin (ZITHROMAX) 1 g powder Take 1 packet (1 g total) by mouth once for 1 dose. 02/26/18 02/26/18  Tyona Nilsen C, PA-C  ondansetron (ZOFRAN ODT) 4 MG disintegrating tablet Take 1 tablet (4 mg total) by mouth every 8 (eight) hours as needed for nausea or vomiting.  02/26/18   Jerred Zaremba, Junius Creamer, PA-C    Family History No family history on file.  Social History Social History   Tobacco Use  . Smoking status: Not on file  Substance Use Topics  . Alcohol use: Not on file  . Drug use: Not on file     Allergies   Patient has no allergy information on record.   Review of Systems Review of Systems  Constitutional: Negative for fever.  Respiratory: Negative for shortness of breath.   Cardiovascular: Negative for chest pain.  Gastrointestinal: Negative for abdominal pain, diarrhea, nausea and vomiting.  Genitourinary: Positive for dysuria, frequency and vaginal discharge. Negative for flank pain, genital sores, hematuria, menstrual problem, vaginal bleeding and vaginal pain.  Musculoskeletal: Negative for back pain.  Skin: Negative for rash.  Neurological: Negative for dizziness, light-headedness and headaches.     Physical Exam Triage Vital Signs ED Triage Vitals  Enc Vitals Group     BP 02/26/18 1620 (!) 144/91     Pulse Rate 02/26/18 1620 82     Resp 02/26/18 1620 18     Temp 02/26/18 1620 98.5 F (36.9 C)     Temp Source 02/26/18 1620 Oral     SpO2 02/26/18 1620 100 %     Weight 02/26/18 1618 170 lb (77.1 kg)  Height --      Head Circumference --      Peak Flow --      Pain Score 02/26/18 1618 4     Pain Loc --      Pain Edu? --      Excl. in GC? --    No data found.  Updated Vital Signs BP (!) 144/91 (BP Location: Right Arm)   Pulse 82   Temp 98.5 F (36.9 C) (Oral)   Resp 18   Wt 170 lb (77.1 kg)   LMP 02/17/2018   SpO2 100%   Visual Acuity Right Eye Distance:   Left Eye Distance:   Bilateral Distance:    Right Eye Near:   Left Eye Near:    Bilateral Near:     Physical Exam Vitals signs and nursing note reviewed.  Constitutional:      General: She is not in acute distress.    Appearance: She is well-developed.     Comments: No acute distress  HENT:     Head: Normocephalic and atraumatic.      Nose: Nose normal.  Eyes:     Conjunctiva/sclera: Conjunctivae normal.  Neck:     Musculoskeletal: Neck supple.  Cardiovascular:     Rate and Rhythm: Normal rate and regular rhythm.     Heart sounds: No murmur.  Pulmonary:     Effort: Pulmonary effort is normal. No respiratory distress.     Breath sounds: Normal breath sounds.  Abdominal:     General: There is no distension.     Palpations: Abdomen is soft.     Tenderness: There is no abdominal tenderness.     Comments: Abdomen soft, nondistended, nontender to light palpation throughout abdomen and suprapubic area, no CVA tenderness  Genitourinary:    Comments: Normal external female genitalia, multiple clustered vesicular and ulcerative lesions with slight yellow discoloration to perineum, large amount of thin yellowish-greenish discharge in vagina, cervix appears erythematous, moderate discomfort with most of exam Musculoskeletal: Normal range of motion.  Skin:    General: Skin is warm and dry.  Neurological:     Mental Status: She is alert and oriented to person, place, and time.      UC Treatments / Results  Labs (all labs ordered are listed, but only abnormal results are displayed) Labs Reviewed  POCT URINALYSIS DIP (DEVICE) - Abnormal; Notable for the following components:      Result Value   Ketones, ur TRACE (*)    Hgb urine dipstick TRACE (*)    Leukocytes, UA TRACE (*)    All other components within normal limits  URINE CULTURE  HSV CULTURE AND TYPING  POC URINE PREG, ED  CERVICOVAGINAL ANCILLARY ONLY    EKG None  Radiology No results found.  Procedures Procedures (including critical care time)  Medications Ordered in UC Medications  cefTRIAXone (ROCEPHIN) injection 250 mg (250 mg Intramuscular Given 02/26/18 1715)  azithromycin (ZITHROMAX) tablet 1,000 mg (1,000 mg Oral Given 02/26/18 1715)    Initial Impression / Assessment and Plan / UC Course  I have reviewed the triage vital signs and the  nursing notes.  Pertinent labs & imaging results that were available during my care of the patient were reviewed by me and considered in my medical decision making (see chart for details).    UA with trace leuks, will send for culture, unlikely UTI. Patient with lesions on perineum concerning for herpes.  HSV swab obtained.  Recommended initiating acyclovir based off appearance.  Vaginal swab obtained also to check for gonorrhea chlamydia and trichomonas.  Given pelvic exam, recommended STD treatment today empirically with Rocephin and azithromycin.  Patient was agreeable.  Provided Rocephin and azithromycin.  Patient vomited up azithromycin briefly after taking the medicine.  Will send in Zofran and 1 g powder pack of azithromycin to take as alternative.  Will call patient with results and provide any further treatment if needed.  Spent time counseling patient on herpes.  Discussed following up with OB/GYN if HSV swab negative for further evaluation of these lesions.Discussed strict return precautions. Patient verbalized understanding and is agreeable with plan.  Final Clinical Impressions(s) / UC Diagnoses   Final diagnoses:  Dysuria  Vaginal discharge  Genital sore     Discharge Instructions     We have treated you today for gonorrhea and chlamydia, with rocephin and azithromycin. Please refrain from sexual intercourse for 7 days while medicines eliminating infection.   We are testing the area that is causing you a lot of pain for herpes.  I recommend going ahead and starting the medicine for this based off the appearance of these lesions.  Please begin acyclovir 400 mg 3 times a day for the next 10 days, please return if symptoms not resolved within this time for more medicine.  We are testing you for Gonorrhea, Chlamydia, Trichomonas, Yeast and Bacterial Vaginosis. We will call you if anything is positive and let you know if you require any further treatment. Please inform partners of any  positive results.   Please return if symptoms not improving with treatment, development of fever, nausea, vomiting, abdominal pain.     ED Prescriptions    Medication Sig Dispense Auth. Provider   acyclovir (ZOVIRAX) 400 MG tablet  (Status: Discontinued) Take 1 tablet (400 mg total) by mouth 3 (three) times daily for 10 days. 30 tablet Zoha Spranger C, PA-C   acyclovir (ZOVIRAX) 400 MG tablet Take 1 tablet (400 mg total) by mouth 3 (three) times daily for 10 days. 30 tablet Briasia Flinders C, PA-C   azithromycin (ZITHROMAX) 1 g powder Take 1 packet (1 g total) by mouth once for 1 dose. 1 each Jayana Kotula C, PA-C   ondansetron (ZOFRAN ODT) 4 MG disintegrating tablet Take 1 tablet (4 mg total) by mouth every 8 (eight) hours as needed for nausea or vomiting. 20 tablet Vir Whetstine, [place] C, PA-C     Controlled Substance Prescriptions [place] Controlled Substance Registry consulted? Not Applicable   Lew Dawes, [place] 02/26/18 1924

## 2018-02-27 LAB — POCT PREGNANCY, URINE: PREG TEST UR: NEGATIVE

## 2018-02-28 LAB — HSV CULTURE AND TYPING

## 2018-03-01 LAB — URINE CULTURE

## 2018-03-02 ENCOUNTER — Telehealth (HOSPITAL_COMMUNITY): Payer: Self-pay | Admitting: Emergency Medicine

## 2018-03-02 LAB — CERVICOVAGINAL ANCILLARY ONLY
Bacterial vaginitis: POSITIVE — AB
CHLAMYDIA, DNA PROBE: NEGATIVE
Candida vaginitis: NEGATIVE
NEISSERIA GONORRHEA: NEGATIVE
Trichomonas: POSITIVE — AB

## 2018-03-02 MED ORDER — CEFDINIR 300 MG PO CAPS: 300.0000 mg | ORAL_CAPSULE | Freq: Two times a day (BID) | ORAL | 0 refills | Status: DC

## 2018-03-02 MED ORDER — METRONIDAZOLE 500 MG PO TABS: 500.0000 mg | ORAL_TABLET | Freq: Two times a day (BID) | ORAL | 0 refills | Status: DC

## 2018-03-02 NOTE — Telephone Encounter (Signed)
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  Given acyclovir at visit.  Urine culture was positive for Pseudomonas aeruginosa. Prescription for omnicef per Dr. Tracie Harrierhagler sent to pharmacy of choice.   Bacterial vaginosis is positive. This was not treated at the urgent care visit.  Flagyl 500 mg BID x 7 days #14 no refills sent to patients pharmacy of choice.    Trichomonas is positive. Pt needs education to refrain from sexual intercourse for 7 days to give the medicine time to work. Sexual partners need to be notified and tested/treated. Condoms may reduce risk of reinfection. Recheck for further evaluation if symptoms are not improving.   Attempted to reach patient. No answer at this time. Voicemail left.

## 2018-03-04 ENCOUNTER — Telehealth (HOSPITAL_COMMUNITY): Payer: Self-pay | Admitting: Emergency Medicine

## 2018-03-04 NOTE — Telephone Encounter (Signed)
Attempted to reach patient x2. No answer at this time. Voicemail left.    

## 2018-03-08 ENCOUNTER — Telehealth (HOSPITAL_COMMUNITY): Payer: Self-pay | Admitting: Emergency Medicine

## 2018-03-08 NOTE — Telephone Encounter (Signed)
Attempted to reach patient x3. No answer at this time. Voicemail left. Letter sent    

## 2018-03-24 ENCOUNTER — Telehealth (HOSPITAL_COMMUNITY): Payer: Self-pay | Admitting: Emergency Medicine

## 2018-03-24 NOTE — Telephone Encounter (Signed)
Received Letter back in mail, states "Return to sender, no such number, unable to forward".

## 2019-08-19 ENCOUNTER — Other Ambulatory Visit: Payer: Self-pay

## 2019-08-19 ENCOUNTER — Encounter (HOSPITAL_COMMUNITY): Payer: Self-pay

## 2019-08-19 ENCOUNTER — Ambulatory Visit (HOSPITAL_COMMUNITY)
Admission: EM | Admit: 2019-08-19 | Discharge: 2019-08-19 | Disposition: A | Discharge status: Home / Self Care | Payer: Self-pay | Attending: Family Medicine | Admitting: Family Medicine

## 2019-08-19 DIAGNOSIS — M5412 Radiculopathy, cervical region: Secondary | ICD-10-CM

## 2019-08-19 DIAGNOSIS — M25511 Pain in right shoulder: Secondary | ICD-10-CM

## 2019-08-19 MED ORDER — KETOROLAC TROMETHAMINE 30 MG/ML IJ SOLN
INTRAMUSCULAR | Status: AC
  Filled 2019-08-19: qty 1

## 2019-08-19 MED ORDER — KETOROLAC TROMETHAMINE 30 MG/ML IJ SOLN
30.0000 mg | Freq: Once | INTRAMUSCULAR | Status: AC
  Administered 2019-08-19: 30 mg via INTRAMUSCULAR

## 2019-08-19 MED ORDER — PREDNISONE 10 MG (21) PO TBPK: ORAL_TABLET | Freq: Every day | ORAL | 0 refills | Status: AC

## 2019-08-19 MED ORDER — IBUPROFEN 800 MG PO TABS: 800.0000 mg | ORAL_TABLET | Freq: Three times a day (TID) | ORAL | 0 refills | Status: AC | PRN

## 2019-08-19 MED ORDER — CYCLOBENZAPRINE HCL 10 MG PO TABS: 10.0000 mg | ORAL_TABLET | Freq: Two times a day (BID) | ORAL | 0 refills | Status: AC | PRN

## 2019-08-19 MED ORDER — DEXAMETHASONE SODIUM PHOSPHATE 10 MG/ML IJ SOLN
10.0000 mg | Freq: Once | INTRAMUSCULAR | Status: AC
Start: 2019-08-19 — End: 2019-08-19
  Administered 2019-08-19: 10 mg via INTRAMUSCULAR

## 2019-08-19 MED ORDER — DEXAMETHASONE SODIUM PHOSPHATE 10 MG/ML IJ SOLN
INTRAMUSCULAR | Status: AC
  Filled 2019-08-19: qty 1

## 2019-08-19 NOTE — ED Triage Notes (Signed)
Pt presents with right arm pain that starts at her shoulder and radiates down to her wrist X 3 days.

## 2019-08-19 NOTE — ED Provider Notes (Signed)
[facility]   [id number] 08/19/19 Arrival Time: 1634  HM:CNOBS PAIN  SUBJECTIVE: History from: patient. Karen Baird is a 39 y.o. female complains of right neck, shoulder and arm pain that began 3 days ago. Denies a precipitating event or specific injury. Reports that she works at [employer] and performs repetitive movements with her R arm. Describes the pain as intermittent and achy in character. Has tried OTC Goody powder without relief. Symptoms are made worse with activity. Denies similar symptoms in the past. Denies fever, chills, erythema, ecchymosis, effusion, weakness, numbness and tingling, saddle paresthesias, loss of bowel or bladder function.      ROS: As per HPI.  All other pertinent ROS negative.     History reviewed. No pertinent past medical history. History reviewed. No pertinent surgical history. No Known Allergies No current facility-administered medications on file prior to encounter.   No current outpatient medications on file prior to encounter.   Social History   Socioeconomic History   Marital status: Single    Spouse name: Not on file   Number of children: Not on file   Years of education: Not on file   Highest education level: Not on file  Occupational History   Not on file  Tobacco Use   Smoking status: Never Smoker  Substance and Sexual Activity   Alcohol use: Not on file   Drug use: Not on file   Sexual activity: Not on file  Other Topics Concern   Not on file  Social History Narrative   Not on file   Social Determinants of Health   Financial Resource Strain:    Difficulty of Paying Living Expenses:   Food Insecurity:    Worried About Running Out of Food in the Last Year:    Merchant navy officer of Food in the Last Year:   Transportation Needs:    Freight forwarder (Medical):    Lack of Transportation (Non-Medical):   Physical Activity:    Days of Exercise per Week:    Minutes of Exercise per Session:   Stress:      Feeling of Stress :   Social Connections:    Frequency of Communication with Friends and Family:    Frequency of Social Gatherings with Friends and Family:    Attends Religious Services:    Active Member of Clubs or Organizations:    Attends Engineer, structural:    Marital Status:   Intimate Partner Violence:    Fear of Current or Ex-Partner:    Emotionally Abused:    Physically Abused:    Sexually Abused:    Family History  Family history unknown: Yes    OBJECTIVE:  Vitals:   08/19/19 1721  BP: (!) 157/103  Pulse: 83  Resp: 18  Temp: 98.7 F (37.1 C)  TempSrc: Oral  SpO2: 100%    General appearance: ALERT; in no acute distress.  Head: NCAT Lungs: Normal respiratory effort CV: radial  pulses 2+ bilaterally. Cap refill < 2 seconds Musculoskeletal:  Inspection: Skin warm, dry, clear and intact without obvious erythema, effusion, or ecchymosis.  Palpation: very tender at R trapezius to palpation ROM: limited active and passive ROM to R arm due to pain Skin: warm and dry Neurologic: Ambulates without difficulty; Sensation intact about the upper/ lower extremities Psychological: alert and cooperative; normal mood and affect  DIAGNOSTIC STUDIES:  No results found.   ASSESSMENT & PLAN:  1. Acute pain of right shoulder   2. Cervical radiculopathy  Meds ordered this encounter  Medications   ketorolac (TORADOL) 30 MG/ML injection 30 mg   dexamethasone (DECADRON) injection 10 mg   predniSONE (STERAPRED UNI-PAK 21 TAB) 10 MG (21) TBPK tablet    Sig: Take by mouth daily for 6 days. Take 6 tablets on day 1, 5 tablets on day 2, 4 tablets on day 3, 3 tablets on day 4, 2 tablets on day 5, 1 tablet on day 6    Dispense:  21 tablet    Refill:  0    Order Specific Question:   Supervising Provider    Answer:   Chase Picket [[id number]]   cyclobenzaprine (FLEXERIL) 10 MG tablet    Sig: Take 1 tablet (10 mg total) by mouth 2 (two) times daily  as needed for muscle spasms.    Dispense:  20 tablet    Refill:  0    Order Specific Question:   Supervising Provider    Answer:   Chase Picket [id number]   ibuprofen (ADVIL) 800 MG tablet    Sig: Take 1 tablet (800 mg total) by mouth every 8 (eight) hours as needed for moderate pain.    Dispense:  21 tablet    Refill:  0    Order Specific Question:   Supervising Provider    Answer:   Chase Picket [id number]    Cervical Radiculopathy R arm pain Gave Decadron 10mg  IM in office Gace Toradol 30mg  in office Prescribed ibuprofen Prescribed prednisone taper Prescribed flexeril Continue conservative management of rest, ice, and gentle stretches Take ibuprofen as needed for pain relief (may cause abdominal discomfort, ulcers, and GI bleeds avoid taking with other NSAIDs) Take cyclobenzaprine at nighttime for symptomatic relief. Avoid driving or operating heavy machinery while using medication. Follow up with PCP if symptoms persist Return or go to the ER if you have any new or worsening symptoms (fever, chills, chest pain, abdominal pain, changes in bowel or bladder habits, pain radiating into lower legs)   Reviewed expectations re: course of current medical issues. Questions answered. Outlined signs and symptoms indicating need for more acute intervention. Patient verbalized understanding. After Visit Summary given.       Faustino Congress, NP 08/19/19 1814

## 2019-08-19 NOTE — Discharge Instructions (Signed)
Take the ibuprofen as prescribed. Rest your hand. Apply ice packs 2-3 times a day for up to 20 minutes each.    Follow up with your primary care provider or an orthopedist if you symptoms continue or worsen;  Or if you develop new symptoms, such as numbness, tingling, or weakness.    I have prescribed a prednisone taper for you to start tomorrow, as well as a muscle relaxer

## 2020-01-12 ENCOUNTER — Other Ambulatory Visit: Payer: Self-pay

## 2020-01-12 ENCOUNTER — Encounter (HOSPITAL_COMMUNITY): Payer: Self-pay | Admitting: Emergency Medicine

## 2020-01-12 ENCOUNTER — Ambulatory Visit (HOSPITAL_COMMUNITY)
Admission: EM | Admit: 2020-01-12 | Discharge: 2020-01-12 | Disposition: A | Discharge status: Home / Self Care | Payer: HRSA Program | Attending: Nurse Practitioner | Admitting: Nurse Practitioner

## 2020-01-12 DIAGNOSIS — R430 Anosmia: Secondary | ICD-10-CM

## 2020-01-12 DIAGNOSIS — R432 Parageusia: Secondary | ICD-10-CM | POA: Diagnosis present

## 2020-01-12 DIAGNOSIS — Z1152 Encounter for screening for COVID-19: Secondary | ICD-10-CM

## 2020-01-12 DIAGNOSIS — Z20822 Contact with and (suspected) exposure to covid-19: Secondary | ICD-10-CM | POA: Diagnosis not present

## 2020-01-12 DIAGNOSIS — R5383 Other fatigue: Secondary | ICD-10-CM | POA: Diagnosis not present

## 2020-01-12 NOTE — Discharge Instructions (Signed)
You may take tylenol or ibuprofen as needed for fevers/headache/body aches. Drink plenty of fluids. Stay in home isolation until you receive results of your COVID test. You will only be notified for positive results. You may go online to MyChart in the next few days and review your results. Please follow CDC guidelines that are attached. You may discontinue home isolation when there has been at least 10 days since symptoms onset AND 3 days fever free without antipyretics (Tylenol or Ibuprofen) AND an overall improvement in your symptoms. Go to the ED immediately if you get worse or have any other symptoms.   Feel better soon!  Kashvi Prevette, FNP-C   

## 2020-01-12 NOTE — ED Triage Notes (Signed)
Pt states she has had a loss of taste and smell since last Saturday. Pt states she has been feeling very fatigued. Pt states she has not been vaccinated.

## 2020-01-12 NOTE — ED Provider Notes (Signed)
[facility]    CSN: [id number] Arrival date & time: 01/12/20  1646      History   Chief Complaint Chief Complaint  Patient presents with  . loss of smell and taste  . Fatigue    HPI Karen Baird is a 39 y.o. female.   Subjective:   Karen Baird is a 39 y.o. female who presents for evaluation of fatigue, nasal congestion as well as loss of taste and smell. Symptoms have been present for 5 days. She denies any chills, headache, myalgias, productive cough, sweats, fever, sore throat, shortness of breath, headache, nausea, vomiting or diarrhea. She hasn't tried anything to alleviate the symptoms.  She denies any prior history of COVID-19.  She denies any known COVID-19 exposure.  She has not been vaccinated against COVID-19.  The following portions of the patient's history were reviewed and updated as appropriate: allergies, current medications, past family history, past medical history, past social history, past surgical history and problem list.       History reviewed. No pertinent past medical history.  There are no problems to display for this patient.   History reviewed. No pertinent surgical history.  OB History   No obstetric history on file.      Home Medications    Prior to Admission medications   Medication Sig Start Date End Date Taking? Authorizing Provider  cyclobenzaprine (FLEXERIL) 10 MG tablet Take 1 tablet (10 mg total) by mouth 2 (two) times daily as needed for muscle spasms. 08/19/19   Moshe Cipro, NP  ibuprofen (ADVIL) 800 MG tablet Take 1 tablet (800 mg total) by mouth every 8 (eight) hours as needed for moderate pain. 08/19/19   Moshe Cipro, NP    Family History Family History  Family history unknown: Yes    Social History Social History   Tobacco Use  . Smoking status: Current Every Day Smoker    Packs/day: 0.50    Types: Cigarettes  . Smokeless tobacco: Never Used  Substance Use Topics  . Alcohol use:  Yes    Comment: occasionally  . Drug use: Not on file     Allergies   Patient has no known allergies.   Review of Systems Review of Systems  Constitutional: Positive for fatigue. Negative for chills, diaphoresis and fever.  HENT: Positive for congestion. Negative for rhinorrhea and sore throat.   Respiratory: Negative for cough and shortness of breath.   Gastrointestinal: Negative for diarrhea, nausea and vomiting.  Musculoskeletal: Negative for myalgias.  Neurological: Negative for dizziness and headaches.  All other systems reviewed and are negative.    Physical Exam Triage Vital Signs ED Triage Vitals  Enc Vitals Group     BP 01/12/20 1733 (!) 161/109     Pulse Rate 01/12/20 1733 79     Resp 01/12/20 1733 18     Temp 01/12/20 1733 98.2 F (36.8 C)     Temp Source 01/12/20 1733 Oral     SpO2 01/12/20 1733 100 %     Weight --      Height --      Head Circumference --      Peak Flow --      Pain Score 01/12/20 1730 0     Pain Loc --      Pain Edu? --      Excl. in GC? --    No data found.  Updated Vital Signs BP (!) 161/109 (BP Location: Left Arm)   Pulse 79  Temp 98.2 F (36.8 C) (Oral)   Resp 18   LMP 12/29/2019   SpO2 100%   Visual Acuity Right Eye Distance:   Left Eye Distance:   Bilateral Distance:    Right Eye Near:   Left Eye Near:    Bilateral Near:     Physical Exam Vitals reviewed.  Constitutional:      General: She is not in acute distress.    Appearance: Normal appearance. She is not ill-appearing.  HENT:     Head: Normocephalic.     Nose: Nose normal.     Mouth/Throat:     Mouth: Mucous membranes are moist.  Cardiovascular:     Rate and Rhythm: Normal rate and regular rhythm.  Pulmonary:     Effort: Pulmonary effort is normal.     Breath sounds: Normal breath sounds.  Musculoskeletal:        General: Normal range of motion.     Cervical back: Normal range of motion and neck supple.  Skin:    General: Skin is warm and dry.    Neurological:     General: No focal deficit present.     Mental Status: She is alert and oriented to person, place, and time.  Psychiatric:        Mood and Affect: Mood normal.        Behavior: Behavior normal.      UC Treatments / Results  Labs (all labs ordered are listed, but only abnormal results are displayed) Labs Reviewed  SARS CORONAVIRUS 2 (TAT 6-24 HRS)    EKG   Radiology No results found.  Procedures Procedures (including critical care time)  Medications Ordered in UC Medications - No data to display  Initial Impression / Assessment and Plan / UC Course  I have reviewed the triage vital signs and the nursing notes.  Pertinent labs & imaging results that were available during my care of the patient were reviewed by me and considered in my medical decision making (see chart for details).     39 y.o. female who presents for evaluation for a five-day history of fatigue, nasal congestion, loss of taste and loss of smell. No other symptoms reported. She is afebrile. Nontoxic appearing. No prior history of COVID-19.  No known COVID-19 exposure.  She has not been vaccinated against COVID-19.   Plan:  Supportive care with appropriate antipyretics and fluids. Patient advised to stay at home isolation until Covid results Further recommendations pending Covid results  Today's evaluation has revealed no signs of a dangerous process. Discussed diagnosis with patient and/or guardian. Patient and/or guardian aware of their diagnosis, possible red flag symptoms to watch out for and need for close follow up. Patient and/or guardian understands verbal and written discharge instructions. Patient and/or guardian comfortable with plan and disposition.  Patient and/or guardian has a clear mental status at this time, good insight into illness (after discussion and teaching) and has clear judgment to make decisions regarding their care  This care was provided during an unprecedented  National Emergency due to the Novel Coronavirus (COVID-19) pandemic. COVID-19 infections and transmission risks place heavy strains on healthcare resources.  As this pandemic evolves, our facility, providers, and staff strive to respond fluidly, to remain operational, and to provide care relative to available resources and information. Outcomes are unpredictable and treatments are without well-defined guidelines. Further, the impact of COVID-19 on all aspects of urgent care, including the impact to patients seeking care for reasons other than COVID-19, is  unavoidable during this national emergency. At this time of the global pandemic, management of patients has significantly changed, even for non-COVID positive patients given high local and regional COVID volumes at this time requiring high healthcare system and resource utilization. The standard of care for management of both COVID suspected and non-COVID suspected patients continues to change rapidly at the local, regional, national, and global levels. This patient was worked up and treated to the best available but ever changing evidence and resources available at this current time.   Documentation was completed with the aid of voice recognition software. Transcription may contain typographical errors. Final Clinical Impressions(s) / UC Diagnoses   Final diagnoses:  Fatigue, unspecified type  Anosmia  Ageusia  Encounter for screening for COVID-19     Discharge Instructions     You may take tylenol or ibuprofen as needed for fevers/headache/body aches. Drink plenty of fluids. Stay in home isolation until you receive results of your COVID test. You will only be notified for positive results. You may go online to MyChart in the next few days and review your results. Please follow CDC guidelines that are attached. You may discontinue home isolation when there has been at least 10 days since symptoms onset AND 3 days fever free without antipyretics  (Tylenol or Ibuprofen) AND an overall improvement in your symptoms. Go to the ED immediately if you get worse or have any other symptoms.   Feel better soon!  Lelon Mast, FNP-C      ED Prescriptions    None     PDMP not reviewed this encounter.   Lurline Idol, [place] 01/12/20 [phone]

## 2020-01-13 LAB — SARS CORONAVIRUS 2 (TAT 6-24 HRS): SARS Coronavirus 2: NEGATIVE

## 2020-04-16 ENCOUNTER — Other Ambulatory Visit: Payer: Self-pay

## 2020-04-16 ENCOUNTER — Ambulatory Visit (HOSPITAL_COMMUNITY)
Admission: EM | Admit: 2020-04-16 | Discharge: 2020-04-16 | Disposition: A | Discharge status: Home / Self Care | Payer: Medicaid Other

## 2020-06-16 ENCOUNTER — Ambulatory Visit (HOSPITAL_COMMUNITY)
Admission: EM | Admit: 2020-06-16 | Discharge: 2020-06-16 | Disposition: A | Discharge status: Home / Self Care | Payer: Medicaid Other

## 2020-06-16 ENCOUNTER — Encounter (HOSPITAL_COMMUNITY): Payer: Self-pay | Admitting: Emergency Medicine

## 2020-06-16 ENCOUNTER — Other Ambulatory Visit: Payer: Self-pay

## 2020-06-16 DIAGNOSIS — R6884 Jaw pain: Secondary | ICD-10-CM

## 2020-06-16 LAB — POC URINE PREG, ED: Preg Test, Ur: NEGATIVE

## 2020-06-16 MED ORDER — KETOROLAC TROMETHAMINE 30 MG/ML IJ SOLN
INTRAMUSCULAR | Status: AC
  Filled 2020-06-16: qty 1

## 2020-06-16 MED ORDER — KETOROLAC TROMETHAMINE 30 MG/ML IJ SOLN
30.0000 mg | Freq: Once | INTRAMUSCULAR | Status: AC
  Administered 2020-06-16: 30 mg via INTRAMUSCULAR

## 2020-06-16 MED ORDER — AMOXICILLIN 875 MG PO TABS: 875.0000 mg | ORAL_TABLET | Freq: Two times a day (BID) | ORAL | 0 refills | Status: AC

## 2020-06-16 NOTE — ED Triage Notes (Signed)
Yesterday, pain in right ear, neck and head.  Now right jaw is hurting as well describes sharp pains.

## 2020-06-16 NOTE — ED Provider Notes (Signed)
[facility]  ____________________________________________  Time seen: Approximately 1:43 PM  I have reviewed the triage vital signs and the nursing notes.   HISTORY  Chief Complaint Otalgia   Historian Patient     HPI Karen Baird is a 40 y.o. female presents to the urgent care with right-sided lower jaw pain.  Patient reports that it hurts to chew on the affected side.  Patient denies anterior neck pain or difficulty swallowing.  No pain underneath the tongue.  Patient denies fever and chills.  She has not been in to see her dentist in "a long time".  Patient is unsure if pain is coming from her right ear.  No discharge from the right ear or changes in hearing.   History reviewed. No pertinent past medical history.   Immunizations up to date:  Yes.     History reviewed. No pertinent past medical history.  There are no problems to display for this patient.   History reviewed. No pertinent surgical history.  Prior to Admission medications   Medication Sig Start Date End Date Taking? Authorizing Provider  Aspirin-Acetaminophen-Caffeine (GOODY HEADACHE PO) Take by mouth.   Yes [provider]  cyclobenzaprine (FLEXERIL) 10 MG tablet Take 1 tablet (10 mg total) by mouth 2 (two) times daily as needed for muscle spasms. 08/19/19   Moshe Cipro, NP  ibuprofen (ADVIL) 800 MG tablet Take 1 tablet (800 mg total) by mouth every 8 (eight) hours as needed for moderate pain. 08/19/19   Moshe Cipro, NP    Allergies Patient has no known allergies.  Family History  Problem Relation Age of Onset  . Cancer Mother     Social History Social History   Tobacco Use  . Smoking status: Current Every Day Smoker    Packs/day: 0.50    Types: Cigarettes  . Smokeless tobacco: Never Used  Vaping Use  . Vaping Use: Never used  Substance Use Topics  . Alcohol use: Yes    Comment: occasionally  . Drug use: Yes    Types: Marijuana     Review of  Systems  Constitutional: No fever/chills Eyes:  No discharge ENT: Patient has right lower jaw pain.  Respiratory: no cough. No SOB/ use of accessory muscles to breath Gastrointestinal:   No nausea, no vomiting.  No diarrhea.  No constipation. Musculoskeletal: Negative for musculoskeletal pain. Skin: Negative for rash, abrasions, lacerations, ecchymosis.    ____________________________________________   PHYSICAL EXAM:  VITAL SIGNS: ED Triage Vitals  Enc Vitals Group     BP 06/16/20 1315 (!) 147/87     Pulse Rate 06/16/20 1315 70     Resp 06/16/20 1315 18     Temp 06/16/20 1315 98.4 F (36.9 C)     Temp Source 06/16/20 1315 Oral     SpO2 06/16/20 1315 98 %     Weight --      Height --      Head Circumference --      Peak Flow --      Pain Score 06/16/20 1311 10     Pain Loc --      Pain Edu? --      Excl. in GC? --      Constitutional: Alert and oriented. Well appearing and in no acute distress. Eyes: Conjunctivae are normal. PERRL. EOMI. Head: Atraumatic. ENT:      Ears: TMs are pearly.       Nose: No congestion/rhinnorhea.      Mouth/Throat: Mucous membranes are  moist.  Patient has dental caries of inferior 31.  No significant swelling of the right lower jaw. Neck: No stridor.  No cervical spine tenderness to palpation. Cardiovascular: Normal rate, regular rhythm. Normal S1 and S2.  Good peripheral circulation. Respiratory: Normal respiratory effort without tachypnea or retractions. Lungs CTAB. Good air entry to the bases with no decreased or absent breath sounds Gastrointestinal: Bowel sounds x 4 quadrants. Soft and nontender to palpation. No guarding or rigidity. No distention. Musculoskeletal: Full range of motion to all extremities. No obvious deformities noted Neurologic:  Normal for age. No gross focal neurologic deficits are appreciated.  Skin:  Skin is warm, dry and intact. No rash noted. Psychiatric: Mood and affect are normal for age. Speech and behavior  are normal.   ____________________________________________   LABS (all labs ordered are listed, but only abnormal results are displayed)  Labs Reviewed  POC URINE PREG, ED   ____________________________________________  EKG   ____________________________________________  RADIOLOGY   No results found.  ____________________________________________    PROCEDURES  Procedure(s) performed:     Procedures     Medications - No data to display   ____________________________________________   INITIAL IMPRESSION / ASSESSMENT AND PLAN / ED COURSE  Pertinent labs & imaging results that were available during my care of the patient were reviewed by me and considered in my medical decision making (see chart for details).       Assessment and plan Dental pain 40 year old female presents to the urgent care with right lower jaw pain.  Because.  33 is affected by dental caries, suspect early dental infection.  Right TM is pearly with no evidence of otitis media.  Patient has no submandibular swelling to suggest sialadenitis or other deep space neck infection.  Will treat with amoxicillin twice daily for the next 7 days and will recommend patient following up with local dentist.    ____________________________________________  FINAL CLINICAL IMPRESSION(S) / ED DIAGNOSES  Final diagnoses:  Jaw pain      NEW MEDICATIONS STARTED DURING THIS VISIT:  ED Discharge Orders    None          This chart was dictated using voice recognition software/Dragon. Despite best efforts to proofread, errors can occur which can change the meaning. Any change was purely unintentional.     Orvil Feil, PA-C 06/16/20 1347

## 2020-06-16 NOTE — Discharge Instructions (Signed)
Take Amoxicillin twice daily for the next ten days.   OPTIONS FOR DENTAL FOLLOW UP CARE  [place] Department of Health and Human Services - Local Safety Net Dental Clinics http://www.ncdhhs.gov/dph/oralhealth/services/safetynetclinics.htm   [facility] ([phone])  [facility] ([phone])  [facility] ([phone])  [facility] ([phone])  [facility] ([phone]) This clinic caters to the indigent population and is on a lottery system. Location: [facility], [address], [place] Clinic Hours: Wednesdays from 6pm - 9pm, patients seen by a lottery system. For dates, call or go to [email or web address] Services: Cleanings, fillings and simple extractions. Payment Options: DENTAL WORK IS FREE OF CHARGE. Bring proof of income or support. Best way to get seen: Arrive at 5:15 pm - this is a lottery, NOT first come/first serve, so arriving earlier will not increase your chances of being seen.     [facility] [phone] Select option 1 for emergencies   Location: [facility], [address], [place] Clinic Hours: No walk-ins accepted - call the day before to schedule an appointment. Check in times are 9:30 am and 1:30 pm. Services: Simple extractions, temporary fillings, pulpectomy/pulp debridement, uncomplicated abscess drainage. Payment Options: PAYMENT IS DUE AT THE TIME OF SERVICE.  Fee is usually $100-200, additional surgical procedures (e.g. abscess drainage) may be extra. Cash, checks, Visa/MasterCard accepted.  Can file Medicaid if patient is covered for dental - patient should call case worker to check. No discount for [facility] patients. Best way to get seen: MUST call the day before and get onto the schedule. Can usually be seen the next 1-2 days. No walk-ins accepted.      [facility] [phone]   Location: [facility], [address], [place] Clinic Hours: M, W, Th, F 8am or 1:30pm, Tues 9a or 1:30 - first come/first served. Services: Simple extractions, temporary fillings, uncomplicated abscess drainage.  You do not need to be an [place] resident. Payment Options: PAYMENT IS DUE AT THE TIME OF SERVICE. Dental insurance, otherwise sliding scale - bring proof of income or support. Depending on income and treatment needed, cost is usually $50-200. Best way to get seen: Arrive early as it is first come/first served.     [facility] [phone]   Location: [address] Clinic Hours: Mon-Thu 8a-5p Services: Most basic dental services including extractions and fillings. Payment Options: PAYMENT IS DUE AT THE TIME OF SERVICE. Sliding scale, up to 50% off - bring proof if income or support. Medicaid with dental option accepted. Best way to get seen: Call to schedule an appointment, can usually be seen within 2 weeks OR they will try to see walk-ins - show up at 8a or 2p (you may have to wait).     [facility] [phone] [place] RESIDENTS ONLY   Location: [facility], [address], [place], [place] [address] Clinic Hours: By appointment only. Monday - Thursday 8am-5pm, Friday 8am-12pm Services: Cleanings, fillings, extractions. Payment Options: PAYMENT IS DUE AT THE TIME OF SERVICE. Cash, Visa or MasterCard. Sliding scale - $30 minimum per service. Best way to get seen: Come in to office, complete packet and make an appointment - need proof of income or support monies for each household member and proof of [place] residence. Usually takes about a month to get in.     [facility] [phone]   Location: [address] [place] Clinic Hours: Walk-in Urgent Care  Dental Services are offered Monday-Friday mornings only. The numbers of emergencies accepted daily is limited to the number of providers available. Maximum 15 - Mondays, Wednesdays & Thursdays Maximum 10 - Tuesdays & Fridays Services: You do not need to be a [place] resident to be seen for a dental emergency. Emergencies are defined as pain, swelling, abnormal bleeding, or dental trauma. Walkins will receive x-rays if needed. NOTE: Dental cleaning is not an emergency. Payment Options: PAYMENT IS DUE AT THE TIME OF SERVICE. Minimum co-pay is $40.00 for uninsured patients. Minimum co-pay is $3.00 for Medicaid with dental coverage. Dental Insurance is accepted and must be presented at time of visit. Medicare does not cover dental. Forms of payment: Cash, credit card, checks. Best way to get seen: If not previously registered with the clinic, walk-in dental registration begins at 7:15 am and is on a first come/first serve basis. If previously registered with the clinic, call to make an appointment.     [facility] [phone] [place] RESIDENTS ONLY   Location: [address], [place], [place] Clinic Hours: Mon-Thu 10a-2p Services: Extractions only! Payment Options: FREE (donations accepted) - bring proof of income or support Best way to get seen: Call and schedule an appointment OR come at 8am on the 1st Monday of every month (except for holidays) when it is first come/first served.     [facility] [phone]   Location: [address], [place] Clinic Hours: Friday mornings Services, Payment Options, Best way to get seen: Call for info  

## 2020-07-26 ENCOUNTER — Other Ambulatory Visit: Payer: Self-pay

## 2020-07-26 ENCOUNTER — Encounter (HOSPITAL_COMMUNITY): Payer: Self-pay

## 2020-07-26 ENCOUNTER — Ambulatory Visit (HOSPITAL_COMMUNITY)
Admission: EM | Admit: 2020-07-26 | Discharge: 2020-07-26 | Disposition: A | Discharge status: Home / Self Care | Payer: Medicaid Other | Attending: Family Medicine | Admitting: Family Medicine

## 2020-07-26 DIAGNOSIS — R220 Localized swelling, mass and lump, head: Secondary | ICD-10-CM

## 2020-07-26 DIAGNOSIS — R519 Headache, unspecified: Secondary | ICD-10-CM

## 2020-07-26 DIAGNOSIS — K047 Periapical abscess without sinus: Secondary | ICD-10-CM

## 2020-07-26 MED ORDER — LIDOCAINE VISCOUS HCL 2 % MT SOLN: 10.0000 mL | OROMUCOSAL | 0 refills | Status: AC | PRN

## 2020-07-26 MED ORDER — AMOXICILLIN-POT CLAVULANATE 875-125 MG PO TABS: 1.0000 | ORAL_TABLET | Freq: Two times a day (BID) | ORAL | 0 refills | Status: AC

## 2020-07-26 NOTE — ED Provider Notes (Signed)
[facility]    CSN: [id number] Arrival date & time: 07/26/20  1015      History   Chief Complaint Chief Complaint  Patient presents with  . Facial Swelling  . Headache    HPI Karen Baird is a 40 y.o. female.   Here today with 3 to 4-day history of right-sided dental pain, facial swelling, headache.  States the pain has been getting worse over time.  Very painful to chew so has not been eating well since onset.  Denies fever, chills, drainage from the area, no recent dental injury though does have a history of broken teeth in this area.  Has not seen her dentist for this issue yet.  Trying over-the-counter pain relievers without relief.     History reviewed. No pertinent past medical history.  There are no problems to display for this patient.   History reviewed. No pertinent surgical history.  OB History   No obstetric history on file.      Home Medications    Prior to Admission medications   Medication Sig Start Date End Date Taking? Authorizing Provider  amoxicillin-clavulanate (AUGMENTIN) 875-125 MG tablet Take 1 tablet by mouth every 12 (twelve) hours. 07/26/20  Yes Particia Nearing, PA-C  lidocaine (XYLOCAINE) 2 % solution Use as directed 10 mLs in the mouth or throat as needed for mouth pain. 07/26/20  Yes Particia Nearing, PA-C  Aspirin-Acetaminophen-Caffeine (GOODY HEADACHE PO) Take by mouth.    [provider]  cyclobenzaprine (FLEXERIL) 10 MG tablet Take 1 tablet (10 mg total) by mouth 2 (two) times daily as needed for muscle spasms. 08/19/19   Moshe Cipro, NP  ibuprofen (ADVIL) 800 MG tablet Take 1 tablet (800 mg total) by mouth every 8 (eight) hours as needed for moderate pain. 08/19/19   Moshe Cipro, NP    Family History Family History  Problem Relation Age of Onset  . Cancer Mother     Social History Social History   Tobacco Use  . Smoking status: Current Every Day Smoker    Packs/day: 0.50     Types: Cigarettes  . Smokeless tobacco: Never Used  Vaping Use  . Vaping Use: Never used  Substance Use Topics  . Alcohol use: Yes    Comment: occasionally  . Drug use: Yes    Types: Marijuana     Allergies   Patient has no known allergies.   Review of Systems Review of Systems Per HPI Physical Exam Triage Vital Signs ED Triage Vitals  Enc Vitals Group     BP 07/26/20 1118 (!) 143/88     Pulse Rate 07/26/20 1118 72     Resp 07/26/20 1118 17     Temp 07/26/20 1118 98.9 F (37.2 C)     Temp Source 07/26/20 1118 Oral     SpO2 07/26/20 1118 100 %     Weight --      Height --      Head Circumference --      Peak Flow --      Pain Score 07/26/20 1117 8     Pain Loc --      Pain Edu? --      Excl. in GC? --    No data found.  Updated Vital Signs BP (!) 143/88 (BP Location: Left Arm)   Pulse 72   Temp 98.9 F (37.2 C) (Oral)   Resp 17   LMP  (LMP Unknown)   SpO2 100%   Visual  Acuity Right Eye Distance:   Left Eye Distance:   Bilateral Distance:    Right Eye Near:   Left Eye Near:    Bilateral Near:     Physical Exam Vitals and nursing note reviewed.  Constitutional:      Appearance: Normal appearance. She is not ill-appearing.  HENT:     Head: Atraumatic.     Comments: Right-sided facial swelling near right upper jaw    Right Ear: Tympanic membrane normal.     Left Ear: Tympanic membrane normal.     Mouth/Throat:     Mouth: Mucous membranes are moist.     Pharynx: Posterior oropharyngeal erythema present. No oropharyngeal exudate.     Comments: Right upper gingival erythema, edema and poor dentition Eyes:     Extraocular Movements: Extraocular movements intact.     Conjunctiva/sclera: Conjunctivae normal.  Cardiovascular:     Rate and Rhythm: Normal rate and regular rhythm.     Heart sounds: Normal heart sounds.  Pulmonary:     Effort: Pulmonary effort is normal.     Breath sounds: Normal breath sounds.  Musculoskeletal:        General: Normal  range of motion.     Cervical back: Normal range of motion and neck supple.  Skin:    General: Skin is warm and dry.  Neurological:     Mental Status: She is alert and oriented to person, place, and time.  Psychiatric:        Mood and Affect: Mood normal.        Thought Content: Thought content normal.        Judgment: Judgment normal.     UC Treatments / Results  Labs (all labs ordered are listed, but only abnormal results are displayed) Labs Reviewed - No data to display  EKG   Radiology No results found.  Procedures Procedures (including critical care time)  Medications Ordered in UC Medications - No data to display  Initial Impression / Assessment and Plan / UC Course  I have reviewed the triage vital signs and the nursing notes.  Pertinent labs & imaging results that were available during my care of the patient were reviewed by me and considered in my medical decision making (see chart for details).     We will treat for infection with Augmentin, viscous lidocaine, over-the-counter pain relievers, salt water gargles.  Follow-up with dentist as soon as possible.  Final Clinical Impressions(s) / UC Diagnoses   Final diagnoses:  Facial swelling  Dental infection  Bad headache   Discharge Instructions   None    ED Prescriptions    Medication Sig Dispense Auth. Provider   amoxicillin-clavulanate (AUGMENTIN) 875-125 MG tablet Take 1 tablet by mouth every 12 (twelve) hours. 14 tablet Particia Nearing, [place]   lidocaine (XYLOCAINE) 2 % solution Use as directed 10 mLs in the mouth or throat as needed for mouth pain. 100 mL Particia Nearing, [place]     PDMP not reviewed this encounter.   Particia Nearing, [place] 07/26/20 1312

## 2020-07-26 NOTE — ED Triage Notes (Signed)
Pt c/o headache and right side facial swelling x 3 days.  Pt states she has taken OTC medicine to reduce the swelling. Pt states it painful to chew.

## 2023-04-12 ENCOUNTER — Encounter (HOSPITAL_COMMUNITY): Payer: Self-pay

## 2023-04-12 ENCOUNTER — Ambulatory Visit (HOSPITAL_COMMUNITY)
Admission: EM | Admit: 2023-04-12 | Discharge: 2023-04-12 | Disposition: A | Discharge status: Home / Self Care | Payer: 59 | Attending: Internal Medicine | Admitting: Internal Medicine

## 2023-04-12 DIAGNOSIS — J069 Acute upper respiratory infection, unspecified: Secondary | ICD-10-CM | POA: Diagnosis not present

## 2023-04-12 LAB — POCT INFLUENZA A/B
Influenza A, POC: NEGATIVE
Influenza B, POC: NEGATIVE

## 2023-04-12 MED ORDER — PREDNISONE 20 MG PO TABS: 40.0000 mg | ORAL_TABLET | Freq: Every day | ORAL | 0 refills | Status: AC

## 2023-04-12 MED ORDER — ONDANSETRON 4 MG PO TBDP: 4.0000 mg | ORAL_TABLET | Freq: Three times a day (TID) | ORAL | 0 refills | Status: AC | PRN

## 2023-04-12 MED ORDER — ONDANSETRON 4 MG PO TBDP
ORAL_TABLET | ORAL | Status: AC
  Filled 2023-04-12: qty 1

## 2023-04-12 MED ORDER — PROMETHAZINE-DM 6.25-15 MG/5ML PO SYRP: 5.0000 mL | ORAL_SOLUTION | Freq: Three times a day (TID) | ORAL | 0 refills | Status: AC | PRN

## 2023-04-12 MED ORDER — ONDANSETRON 4 MG PO TBDP
4.0000 mg | ORAL_TABLET | Freq: Once | ORAL | Status: AC
  Administered 2023-04-12: 4 mg via ORAL

## 2023-04-12 NOTE — ED Provider Notes (Addendum)
[facility]    CSN: [id number] Arrival date & time: 04/12/23  1148      History   Chief Complaint Chief Complaint  Patient presents with   Nausea   Cough   Nasal Congestion   Headache    HPI Karen Baird is a 43 y.o. female.   43 year old female who presents to urgent care with complaints of cough, congestion, body aches, headaches, diarrhea and nausea.  Her symptoms started on Thursday.  She has been taking Mucinex, NyQuil and Robitussin but this is not helping her symptoms.  She feels like she has gotten worse since Thursday.  She does work at [employer] and it is around kids all day.  She has been unable to tolerate eating since Thursday.  She is staying hydrated.  She denies any abdominal pain, dysuria, hematuria, ear pain, eye pain, sore throat, vomiting.   Cough Associated symptoms: headaches   Associated symptoms: no chest pain, no chills, no ear pain, no fever, no rash, no shortness of breath and no sore throat   Headache Associated symptoms: congestion, cough, diarrhea and nausea   Associated symptoms: no abdominal pain, no back pain, no ear pain, no eye pain, no fever, no seizures, no sore throat and no vomiting     History reviewed. No pertinent past medical history.  There are no active problems to display for this patient.   History reviewed. No pertinent surgical history.  OB History   No obstetric history on file.      Home Medications    Prior to Admission medications   Medication Sig Start Date End Date Taking? Authorizing Provider  amoxicillin-clavulanate (AUGMENTIN) 875-125 MG tablet Take 1 tablet by mouth every 12 (twelve) hours. 07/26/20   Particia Nearing, PA-C  Aspirin-Acetaminophen-Caffeine (GOODY HEADACHE PO) Take by mouth.    [provider]  cyclobenzaprine (FLEXERIL) 10 MG tablet Take 1 tablet (10 mg total) by mouth 2 (two) times daily as needed for muscle spasms. 08/19/19   Moshe Cipro, FNP   ibuprofen (ADVIL) 800 MG tablet Take 1 tablet (800 mg total) by mouth every 8 (eight) hours as needed for moderate pain. 08/19/19   Moshe Cipro, FNP  lidocaine (XYLOCAINE) 2 % solution Use as directed 10 mLs in the mouth or throat as needed for mouth pain. 07/26/20   Particia Nearing, PA-C    Family History Family History  Problem Relation Age of Onset   Cancer Mother     Social History Social History   Tobacco Use   Smoking status: Every Day    Current packs/day: 0.50    Types: Cigarettes   Smokeless tobacco: Never  Vaping Use   Vaping status: Never Used  Substance Use Topics   Alcohol use: Yes    Comment: occasionally   Drug use: Yes    Types: Marijuana     Allergies   Patient has no known allergies.   Review of Systems Review of Systems  Constitutional:  Negative for chills and fever.  HENT:  Positive for congestion. Negative for ear pain and sore throat.   Eyes:  Negative for pain and visual disturbance.  Respiratory:  Positive for cough. Negative for shortness of breath.   Cardiovascular:  Negative for chest pain and palpitations.  Gastrointestinal:  Positive for diarrhea and nausea. Negative for abdominal pain and vomiting.  Genitourinary:  Negative for dysuria and hematuria.  Musculoskeletal:  Negative for arthralgias and back pain.  Skin:  Negative for color change  and rash.  Neurological:  Positive for headaches. Negative for seizures and syncope.  All other systems reviewed and are negative.    Physical Exam Triage Vital Signs ED Triage Vitals [04/12/23 1331]  Encounter Vitals Group     BP 134/84     Systolic BP Percentile      Diastolic BP Percentile      Pulse Rate 86     Resp 16     Temp 99 F (37.2 C)     Temp Source Oral     SpO2 93 %     Weight      Height      Head Circumference      Peak Flow      Pain Score 4     Pain Loc      Pain Education      Exclude from Growth Chart    No data found.  Updated Vital Signs BP  134/84 (BP Location: Left Arm)   Pulse 86   Temp 99 F (37.2 C) (Oral)   Resp 16   LMP 03/29/2023 (Approximate)   SpO2 93%   Visual Acuity Right Eye Distance:   Left Eye Distance:   Bilateral Distance:    Right Eye Near:   Left Eye Near:    Bilateral Near:     Physical Exam Vitals and nursing note reviewed.  Constitutional:      General: She is not in acute distress.    Appearance: She is well-developed.  HENT:     Head: Normocephalic and atraumatic.     Right Ear: Tympanic membrane normal.     Left Ear: Tympanic membrane normal.     Nose: Congestion present.     Mouth/Throat:     Mouth: Mucous membranes are moist.     Pharynx: Posterior oropharyngeal erythema (Very  mild) present.  Eyes:     Conjunctiva/sclera: Conjunctivae normal.  Cardiovascular:     Rate and Rhythm: Normal rate and regular rhythm.     Heart sounds: No murmur heard. Pulmonary:     Effort: Pulmonary effort is normal. No respiratory distress.     Breath sounds: Normal breath sounds.  Abdominal:     Palpations: Abdomen is soft.     Tenderness: There is no abdominal tenderness.  Musculoskeletal:        General: No swelling.     Cervical back: Neck supple.  Skin:    General: Skin is warm and dry.     Capillary Refill: Capillary refill takes less than 2 seconds.  Neurological:     Mental Status: She is alert.  Psychiatric:        Mood and Affect: Mood normal.      UC Treatments / Results  Labs (all labs ordered are listed, but only abnormal results are displayed) Labs Reviewed - No data to display  EKG   Radiology No results found.  Procedures Procedures (including critical care time)  Medications Ordered in UC Medications - No data to display  Initial Impression / Assessment and Plan / UC Course  I have reviewed the triage vital signs and the nursing notes.  Pertinent labs & imaging results that were available during my care of the patient were reviewed by me and considered in  my medical decision making (see chart for details).     Viral URI with cough  Flu A and B testing done today and are negative. Symptoms most consistent with a viral upper respiratory infection. This does not  require antibiotics. We will treat with the following:  Promethazine DM 5 mL every 8 hours as needed for cough.  Use caution as this medication can cause drowsiness. Prednisone 40 mg (2 tablets) daily for 5 days. Take this in the morning. This is a steroid to help with inflammation and pain.  Zofran 4 mg orally disintegrating tablet every 8 hours as needed for nausea.  Rest and stay hydrated.  Return to urgent care or PCP if symptoms worsen or fail to resolve.    Final Clinical Impressions(s) / UC Diagnoses   Final diagnoses:  None   Discharge Instructions   None    ED Prescriptions   None    PDMP not reviewed this encounter.   Landis Martins, PA-C 04/12/23 1453    Landis Martins, PA-C 04/12/23 1455

## 2023-04-12 NOTE — ED Triage Notes (Signed)
Patient c/o nausea, a non productive cough, nasal congestion, and a headache x 3 days.  Patient states she has taken Robitussin, Mucinex with no relief.

## 2023-04-12 NOTE — Discharge Instructions (Addendum)
Flu A and B testing done today and are negative. Symptoms most consistent with a viral upper respiratory infection. This does not require antibiotics. We will treat with the following:  Promethazine DM 5 mL every 8 hours as needed for cough.  Use caution as this medication can cause drowsiness. Prednisone 40 mg (2 tablets) daily for 5 days. Take this in the morning. This is a steroid to help with inflammation and pain.  Zofran 4 mg orally disintegrating tablet every 8 hours as needed for nausea.  Rest and stay hydrated.  Return to urgent care or PCP if symptoms worsen or fail to resolve.
